# Patient Record
Sex: Male | Born: 1950 | Race: White | Hispanic: No | Marital: Married | Smoking: Never smoker
Health system: Southern US, Community
[De-identification: ages and names within clinical notes are randomized; demographics above are authoritative.]

## PROBLEM LIST (undated history)

## (undated) DIAGNOSIS — E785 Hyperlipidemia, unspecified: Secondary | ICD-10-CM

## (undated) DIAGNOSIS — E7800 Pure hypercholesterolemia, unspecified: Secondary | ICD-10-CM

## (undated) DIAGNOSIS — K219 Gastro-esophageal reflux disease without esophagitis: Secondary | ICD-10-CM

## (undated) DIAGNOSIS — M13171 Monoarthritis, not elsewhere classified, right ankle and foot: Secondary | ICD-10-CM

## (undated) DIAGNOSIS — I7300 Raynaud's syndrome without gangrene: Secondary | ICD-10-CM

## (undated) DIAGNOSIS — R001 Bradycardia, unspecified: Secondary | ICD-10-CM

## (undated) DIAGNOSIS — N529 Male erectile dysfunction, unspecified: Secondary | ICD-10-CM

## (undated) DIAGNOSIS — Z8489 Family history of other specified conditions: Secondary | ICD-10-CM

## (undated) DIAGNOSIS — M722 Plantar fascial fibromatosis: Secondary | ICD-10-CM

## (undated) DIAGNOSIS — C61 Malignant neoplasm of prostate: Secondary | ICD-10-CM

## (undated) DIAGNOSIS — E559 Vitamin D deficiency, unspecified: Secondary | ICD-10-CM

## (undated) HISTORY — DX: Male erectile dysfunction, unspecified: N52.9

## (undated) HISTORY — DX: Pure hypercholesterolemia, unspecified: E78.00

## (undated) HISTORY — DX: Plantar fascial fibromatosis: M72.2

## (undated) HISTORY — DX: Gastro-esophageal reflux disease without esophagitis: K21.9

## (undated) HISTORY — DX: Raynaud's syndrome without gangrene: I73.00

## (undated) HISTORY — PX: HERNIA REPAIR: SHX51

## (undated) HISTORY — DX: Vitamin D deficiency, unspecified: E55.9

## (undated) HISTORY — DX: Monoarthritis, not elsewhere classified, right ankle and foot: M13.171

## (undated) HISTORY — PX: TONSILLECTOMY: SUR1361

---

## 2003-10-14 HISTORY — PX: BUNIONECTOMY: SHX129

## 2005-06-05 ENCOUNTER — Ambulatory Visit (HOSPITAL_COMMUNITY): Admission: RE | Admit: 2005-06-05 | Discharge: 2005-06-05 | Payer: Self-pay | Admitting: Orthopedic Surgery

## 2005-06-05 ENCOUNTER — Ambulatory Visit (HOSPITAL_BASED_OUTPATIENT_CLINIC_OR_DEPARTMENT_OTHER): Admission: RE | Admit: 2005-06-05 | Discharge: 2005-06-05 | Payer: Self-pay | Admitting: Orthopedic Surgery

## 2011-01-03 ENCOUNTER — Encounter (INDEPENDENT_AMBULATORY_CARE_PROVIDER_SITE_OTHER): Payer: BC Managed Care – PPO

## 2011-01-03 DIAGNOSIS — M7989 Other specified soft tissue disorders: Secondary | ICD-10-CM
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
## 2011-06-11 ENCOUNTER — Encounter: Payer: Self-pay | Admitting: Gastroenterology

## 2014-03-22 ENCOUNTER — Encounter: Payer: Self-pay | Admitting: Gastroenterology

## 2016-04-29 ENCOUNTER — Ambulatory Visit (INDEPENDENT_AMBULATORY_CARE_PROVIDER_SITE_OTHER): Payer: PRIVATE HEALTH INSURANCE

## 2016-04-29 ENCOUNTER — Encounter: Payer: Self-pay | Admitting: Podiatry

## 2016-04-29 ENCOUNTER — Ambulatory Visit (INDEPENDENT_AMBULATORY_CARE_PROVIDER_SITE_OTHER): Payer: PRIVATE HEALTH INSURANCE | Admitting: Podiatry

## 2016-04-29 VITALS — BP 124/60 | HR 56 | Resp 12

## 2016-04-29 DIAGNOSIS — M722 Plantar fascial fibromatosis: Secondary | ICD-10-CM

## 2016-04-29 NOTE — Patient Instructions (Signed)
Plantar Fasciitis Plantar fasciitis is a painful foot condition that affects the heel. It occurs when the band of tissue that connects the toes to the heel bone (plantar fascia) becomes irritated. This can happen after exercising too much or doing other repetitive activities (overuse injury). The pain from plantar fasciitis can range from mild irritation to severe pain that makes it difficult for you to walk or move. The pain is usually worse in the morning or after you have been sitting or lying down for a while. CAUSES This condition may be caused by:  Standing for long periods of time.  Wearing shoes that do not fit.  Doing high-impact activities, including running, aerobics, and ballet.  Being overweight.  Having an abnormal way of walking (gait).  Having tight calf muscles.  Having high arches in your feet.  Starting a new athletic activity. SYMPTOMS The main symptom of this condition is heel pain. Other symptoms include:  Pain that gets worse after activity or exercise.  Pain that is worse in the morning or after resting.  Pain that goes away after you walk for a few minutes. DIAGNOSIS This condition may be diagnosed based on your signs and symptoms. Your health care provider will also do a physical exam to check for:  A tender area on the bottom of your foot.  A high arch in your foot.  Pain when you move your foot.  Difficulty moving your foot. You may also need to have imaging studies to confirm the diagnosis. These can include:  X-rays.  Ultrasound.  MRI. TREATMENT  Treatment for plantar fasciitis depends on the severity of the condition. Your treatment may include:  Rest, ice, and over-the-counter pain medicines to manage your pain.  Exercises to stretch your calves and your plantar fascia.  A splint that holds your foot in a stretched, upward position while you sleep (night splint).  Physical therapy to relieve symptoms and prevent problems in the  future.  Cortisone injections to relieve severe pain.  Extracorporeal shock wave therapy (ESWT) to stimulate damaged plantar fascia with electrical impulses. It is often used as a last resort before surgery.  Surgery, if other treatments have not worked after 12 months. HOME CARE INSTRUCTIONS  Take medicines only as directed by your health care provider.  Avoid activities that cause pain.  Roll the bottom of your foot over a bag of ice or a bottle of cold water. Do this for 20 minutes, 3-4 times a day.  Perform simple stretches as directed by your health care provider.  Try wearing athletic shoes with air-sole or gel-sole cushions or soft shoe inserts.  Wear a night splint while sleeping, if directed by your health care provider.  Keep all follow-up appointments with your health care provider. PREVENTION   Do not perform exercises or activities that cause heel pain.  Consider finding low-impact activities if you continue to have problems.  Lose weight if you need to. The best way to prevent plantar fasciitis is to avoid the activities that aggravate your plantar fascia. SEEK MEDICAL CARE IF:  Your symptoms do not go away after treatment with home care measures.  Your pain gets worse.  Your pain affects your ability to move or do your daily activities.   This information is not intended to replace advice given to you by your health care provider. Make sure you discuss any questions you have with your health care provider.   Document Released: 06/24/2001 Document Revised: 06/20/2015 Document Reviewed: 08/09/2014 Elsevier   Interactive Patient Education 2016 [employer] Inc.  

## 2016-04-29 NOTE — Progress Notes (Signed)
   Subjective:    Patient ID: Joshua MuskratDavid M Jarvis, male    DOB: 1951-01-14, 65 y.o.   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 He states that he is an avid runner and exercise enthusiast and has noticed with running he has more pain. He has had orthotics made in the past which seemed to help significantly. He is not interested in oral or injectable steroid anti-inflammatories nor is he interested in NSAIDs. He relates the ice and massage therapy have helped to some degree but have failed to alleviate his symptoms entirely. He states that he wears good tennis shoes during surgery and replaces them regularly. He presents today with his orthotics in his tennis shoes. He relates these orthotics are very old.    Review of Systems  Musculoskeletal: Positive for gait problem.       Objective:   Physical Exam: Vital signs are stable alert and oriented 3. Pulses are palpable. Neurologic sensorium is intact. Deep tendon reflexes are intact muscle strength is normal bilateral. Orthopedic evaluation demonstrates rectus foot type very narrow thin foot and prominent fifth metatarsals bilateral pain on palpation medial calcaneal tubercles bilateral. More significant on the left heel. Radiographic evaluation 3 views bilateral foot taken today demonstrates plantar distally oriented calcaneal heel spur left soft tissue increase in density of the plantar fascia calcaneal insertion site bilaterally. Cutaneous evaluation does not demonstrate any type of open wounds or lesions. No calluses.        Assessment & Plan:  Plantar fasciitis bilateral. This is long-standing and intractable.  Plan: He was scanned today for set of orthotics. At this point he will snow oral or injectable therapies. We started EPAT therapy today and he will follow-up next week with Joshua BumpsJessica for his second therapy.

## 2016-05-08 ENCOUNTER — Ambulatory Visit (INDEPENDENT_AMBULATORY_CARE_PROVIDER_SITE_OTHER): Payer: PRIVATE HEALTH INSURANCE

## 2016-05-08 DIAGNOSIS — M722 Plantar fascial fibromatosis: Secondary | ICD-10-CM

## 2016-05-09 NOTE — Progress Notes (Signed)
   Subjective:    Patient ID: Joshua Jarvis, male    DOB: 12-20-1950, 65 y.o.   MRN: [id number]  HPI Pt presents stating he has pain in the arches of both feet, states he has noticed improvement in pain since EPAT treatment last week   Review of Systems All other systems negative    Objective:   Physical Exam  Mild pain upon palpation of mid arch to plantar forefoot, just below metatarsal heads 4 of 10, Lt over Rt, but involvement of both feet      Assessment & Plan:  ESWT treatment administered today to bilateral arches of feet. Pt tolerated 5.5 joules of energy on each arch with intermittent pain 4-6 of 10 for 2000 pulses each. EPAT administered to surrounding connective tissues for 1500 pulses each. Re-appointed in 1 week for 3rd treatment. Advised against use of anti-inflammatories and ice.

## 2016-05-15 ENCOUNTER — Ambulatory Visit: Payer: PRIVATE HEALTH INSURANCE

## 2016-05-15 DIAGNOSIS — M722 Plantar fascial fibromatosis: Secondary | ICD-10-CM

## 2016-05-15 NOTE — Patient Instructions (Signed)

## 2016-05-16 NOTE — Progress Notes (Signed)
   Subjective:    Patient ID: Joshua Jarvis, male    DOB: Oct 04, 1951, 65 y.o.   MRN: [id number]  HPI Pt presents stating he has pain in the arches of both feet, states he has noticed improvement in pain since EPAT treatment last week   Review of Systems All other systems negative    Objective:   Physical Exam  Mild pain upon palpation of mid arch to plantar forefoot, just below metatarsal heads 4 of 10, Lt over Rt, but involvement of both feet      Assessment & Plan:  ESWT treatment administered today to bilateral arches of feet. Pt tolerated 6.5 joules of energy on each arch with intermittent pain 4-6 of 10 for 2000 pulses each. EPAT administered to surrounding connective tissues for 1500 pulses each. Re-appointed in 4 week for 4th  Treatment and re-evaluation with Dr Al Corpus. Advised against use of anti-inflammatories and ice.

## 2016-06-12 ENCOUNTER — Ambulatory Visit (INDEPENDENT_AMBULATORY_CARE_PROVIDER_SITE_OTHER): Payer: PRIVATE HEALTH INSURANCE

## 2016-06-12 DIAGNOSIS — M722 Plantar fascial fibromatosis: Secondary | ICD-10-CM

## 2016-06-12 NOTE — Progress Notes (Signed)
   Subjective:    Patient ID: Joshua MuskratDavid M Dario, male    DOB: [birth year], 65 y.o.   MRN: [id number]  HPI Pt presents stating he has pain in the arches of both feet, states he has noticed improvement in pain since last treatment. He is able to stand for longer periods of time.    Review of Systems All other systems negative    Objective:   Physical Exam  Mild pain upon palpation of mid arch to plantar forefoot, just below metatarsal heads 4 of 10, Lt over Rt, but involvement of both feet      Assessment & Plan:  ESWT treatment administered today to bilateral arches of feet. Pt tolerated 7 joules of energy on each arch with intermittent pain 4-6 of 10 for 2000 pulses each. EPAT administered to surrounding connective tissues for 1500 pulses each. Re-appointed in 6 weeks for re-evaluation. Orthotics were sent back for adjustments today as well

## 2016-06-25 ENCOUNTER — Telehealth: Payer: Self-pay | Admitting: Podiatry

## 2016-06-25 NOTE — Telephone Encounter (Signed)
Left message for patient that adjusted Orthotics are ready to be picked up 

## 2016-07-24 ENCOUNTER — Ambulatory Visit: Payer: PRIVATE HEALTH INSURANCE | Admitting: Podiatry

## 2016-07-31 ENCOUNTER — Ambulatory Visit: Payer: PRIVATE HEALTH INSURANCE | Admitting: Podiatry

## 2016-08-21 ENCOUNTER — Ambulatory Visit: Payer: PRIVATE HEALTH INSURANCE | Admitting: Podiatry

## 2016-09-23 ENCOUNTER — Ambulatory Visit: Payer: PRIVATE HEALTH INSURANCE | Admitting: Podiatry

## 2016-10-16 ENCOUNTER — Encounter: Payer: Self-pay | Admitting: Podiatry

## 2016-10-16 ENCOUNTER — Ambulatory Visit (INDEPENDENT_AMBULATORY_CARE_PROVIDER_SITE_OTHER): Payer: Self-pay | Admitting: Podiatry

## 2016-10-16 DIAGNOSIS — M722 Plantar fascial fibromatosis: Secondary | ICD-10-CM

## 2016-10-19 NOTE — Progress Notes (Signed)
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 He states that he is at least 80-85% improved.  Objective: Pulses are strongly palpable no reproducible pain on palpation of bilateral heels. There is no erythema cellulitis drainage or odor warmth on palpation. No ecchymosis noted heel pain on medial and lateral compression of the calcaneus.  Assessment: Well-healing plantar fasciitis secondary therapy.  Plan: He will follow up with us on an as-needed basis.  Dr. [name] calls and requests EPAT therapy with Joshua Jarvis he is to be scheduled at his convenience without having to see me.

## 2018-10-08 ENCOUNTER — Other Ambulatory Visit: Payer: Self-pay | Admitting: Internal Medicine

## 2018-10-08 DIAGNOSIS — E7800 Pure hypercholesterolemia, unspecified: Secondary | ICD-10-CM

## 2018-11-16 ENCOUNTER — Ambulatory Visit
Admission: RE | Admit: 2018-11-16 | Discharge: 2018-11-16 | Disposition: A | Discharge status: Home / Self Care | Payer: Self-pay | Source: Ambulatory Visit | Attending: Internal Medicine | Admitting: Internal Medicine

## 2018-11-16 DIAGNOSIS — E7800 Pure hypercholesterolemia, unspecified: Secondary | ICD-10-CM

## 2018-11-16 DIAGNOSIS — R931 Abnormal findings on diagnostic imaging of heart and coronary circulation: Secondary | ICD-10-CM

## 2018-11-16 HISTORY — DX: Abnormal findings on diagnostic imaging of heart and coronary circulation: R93.1

## 2018-12-03 ENCOUNTER — Other Ambulatory Visit: Payer: Self-pay

## 2018-12-14 ENCOUNTER — Encounter: Payer: Self-pay | Admitting: Interventional Cardiology

## 2018-12-14 NOTE — Progress Notes (Signed)
Cardiology Office Note   Date:  12/15/2018   ID:  Joshua Jarvis, DOB 07-24-1951, MRN [id number]  PCP:  Kirby Funk, MD    No chief complaint on file.  CAD  Wt Readings from Last 3 Encounters:  12/15/18 184 lb 6.4 oz (83.6 kg)       History of Present Illness: Joshua Jarvis is a 68 y.o. male who is being seen today for the evaluation of elevated coronary calcium at the request of Kirby Funk, MD.  Coronary calcium score CT done in 2/20 showed: The observed calcium score of 1149 is at the percentile 92 for subjects of the same age, gender and race/ethnicity who are free of clinical cardiovascular disease and treated diabetes.  Since the CT, he has been dialing back on his exercise.  He typically uses the treadmill and goes to a HR of 150.    Denies : Chest pain. Dizziness. Leg edema. Nitroglycerin use. Orthopnea. Palpitations. Paroxysmal nocturnal dyspnea. Shortness of breath. Syncope.   He mountain bikes as well.  He swims and uses the elliptical.    Past Medical History:  Diagnosis Date  . Erectile dysfunction   . GERD (gastroesophageal reflux disease)   . Hypercholesterolemia   . Monoarthritis of foot, right   . Plantar fasciitis   . 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 Vitamin D deficiency     No past surgical history on file.   Current Outpatient Medications  Medication Sig Dispense Refill  . aspirin EC 81 MG tablet Take 81 mg by mouth daily.    Marland Kitchen atorvastatin (LIPITOR) 40 MG tablet Take 40 mg by mouth daily.    . cholecalciferol (VITAMIN D3) 25 MCG (1000 UT) tablet Take 1,000 Units by mouth daily.    . famotidine (PEPCID AC) 10 MG chewable tablet Chew 10 mg by mouth 2 (two) times daily.    . Multiple Vitamin (MULTIVITAMIN) capsule Take 1 capsule by mouth daily.    . sildenafil (REVATIO) 20 MG tablet Take 20 mg by mouth as needed (take 2-5 tabs daily as needed).     No current facility-administered medications for  this visit.     Allergies:   Patient has no known allergies.    Social History:  The patient  reports that he has never smoked. He has never used smokeless tobacco. He reports current alcohol use. He reports that he does not use drugs.   Family History:  The patient's family history includes Atrial fibrillation in his father; Heart failure in his mother.    ROS:  Please see the history of present illness.   Otherwise, review of systems are positive for decreasing exercise since getting his CT result.   All other systems are reviewed and negative.    PHYSICAL EXAM: VS:  BP 136/80   Pulse (!) 49   Ht 6\' 2"  (1.88 m)   Wt 184 lb 6.4 oz (83.6 kg)   SpO2 98%   BMI 23.68 kg/m  , BMI Body mass index is 23.68 kg/m. GEN: Well nourished, well developed, in no acute distress  HEENT: normal  Neck: no JVD, carotid bruits, or masses Cardiac: RRR; no murmurs, rubs, or gallops,no edema  Respiratory:  clear to auscultation bilaterally, normal work of breathing GI: soft, nontender, nondistended, + BS MS: no deformity or atrophy  Skin: warm and dry, no rash Neuro:  Strength and sensation are intact Psych: euthymic mood, full affect  EKG:   The ekg ordered today demonstrates sinus bradycardia, no ST changes   Recent Labs: No results found for requested labs within last 8760 hours.   Lipid Panel No results found for: CHOL, TRIG, HDL, CHOLHDL, VLDL, LDLCALC, LDLDIRECT   Other studies Reviewed: Additional studies/ records that were reviewed today with results demonstrating: Labs reviewed.  HDL was 67 but he says this is a decrease from prior.   ASSESSMENT AND PLAN:  1. CAD: Elevated calcium score.  He achieves a high level of exercise at home.  No angina.  Will check exercise treadmill test to ensure no objective signs of ischemia with high-level exercise.  I would like him to be pushed to his maximum calculated heart rate of 154 bpm.  He typically pushes himself to approximately this  level when exercising at home. 2. Hyperlipidemia: Atorvastatin 40 mg daily was started.  LDL target would be less than 100. 3. Vegetarian diet: We spoke at length about theplant based diet.  He already eats a very healthy, plant-based diet.   Current medicines are reviewed at length with the patient today.  The patient concerns regarding his medicines were addressed.  The following changes have been made:  No change  Labs/ tests ordered today include:  No orders of the defined types were placed in this encounter.   Recommend 150 minutes/week of aerobic exercise Low fat, low carb, high fiber diet recommended  Disposition:   FU in 1 year; if he has any significant abnormality on the treadmill test, would consider CT angiogram.   Signed, Lance Muss, MD  12/15/2018 3:46 PM    [facility] [address] [place], [place], [place]  [address] Phone: [phone]; Fax: [phone]

## 2018-12-15 ENCOUNTER — Encounter: Payer: Self-pay | Admitting: Interventional Cardiology

## 2018-12-15 ENCOUNTER — Encounter

## 2018-12-15 ENCOUNTER — Ambulatory Visit: Payer: PRIVATE HEALTH INSURANCE | Admitting: Interventional Cardiology

## 2018-12-15 VITALS — BP 136/80 | HR 49 | Ht 74.0 in | Wt 184.4 lb

## 2018-12-15 DIAGNOSIS — E782 Mixed hyperlipidemia: Secondary | ICD-10-CM | POA: Diagnosis not present

## 2018-12-15 DIAGNOSIS — R931 Abnormal findings on diagnostic imaging of heart and coronary circulation: Secondary | ICD-10-CM

## 2018-12-15 DIAGNOSIS — I2510 Atherosclerotic heart disease of native coronary artery without angina pectoris: Secondary | ICD-10-CM | POA: Diagnosis not present

## 2018-12-15 NOTE — Patient Instructions (Signed)
Medication Instructions:  Your physician recommends that you continue on your current medications as directed. Please refer to the Current Medication list given to you today.  If you need a refill on your cardiac medications before your next appointment, please call your pharmacy.   Lab work: None Ordered  If you have labs (blood work) drawn today and your tests are completely normal, you will receive your results only by: Marland Kitchen MyChart Message (if you have MyChart) OR . A paper copy in the mail If you have any lab test that is abnormal or we need to change your treatment, we will call you to review the results.  Testing/Procedures: Your physician has requested that you have an exercise tolerance test. For further information please visit [email or web address] Please also follow instruction sheet, as given.  Follow-Up: At [facility], you and your health needs are our priority.  As part of our continuing mission to provide you with exceptional heart care, we have created designated Provider Care Teams.  These Care Teams include your primary Cardiologist (physician) and Advanced Practice Providers (APPs -  Physician Assistants and Nurse Practitioners) who all work together to provide you with the care you need, when you need it. . You will need a follow up appointment in 1 year.  Please call our office 2 months in advance to schedule this appointment.  You may see Everette Rank, MD or one of the following Advanced Practice Providers on your designated Care Team:   . Robbie Lis, PA-C . Dayna Dunn, PA-C . Jacolyn Reedy, PA-C  Any Other Special Instructions Will Be Listed Below (If Applicable).

## 2018-12-24 ENCOUNTER — Other Ambulatory Visit: Payer: Self-pay

## 2018-12-24 ENCOUNTER — Ambulatory Visit (INDEPENDENT_AMBULATORY_CARE_PROVIDER_SITE_OTHER): Payer: PRIVATE HEALTH INSURANCE

## 2018-12-24 DIAGNOSIS — I2510 Atherosclerotic heart disease of native coronary artery without angina pectoris: Secondary | ICD-10-CM

## 2018-12-24 DIAGNOSIS — R931 Abnormal findings on diagnostic imaging of heart and coronary circulation: Secondary | ICD-10-CM | POA: Diagnosis not present

## 2018-12-24 LAB — EXERCISE TOLERANCE TEST
Estimated workload: 16.1 METS
Exercise duration (min): 12 min
Exercise duration (sec): 45 s
MPHR: 154 {beats}/min
Peak HR: 153 {beats}/min
Percent HR: 153 %
RPE: 18
Rest HR: 50 {beats}/min

## 2019-01-13 ENCOUNTER — Ambulatory Visit: Payer: No Typology Code available for payment source | Admitting: Interventional Cardiology

## 2019-02-03 DIAGNOSIS — K2270 Barrett's esophagus without dysplasia: Secondary | ICD-10-CM | POA: Insufficient documentation

## 2019-02-03 DIAGNOSIS — Z86010 Personal history of colonic polyps: Secondary | ICD-10-CM | POA: Insufficient documentation

## 2019-12-18 NOTE — Progress Notes (Signed)
Cardiology Office Note   Date:  12/19/2019   ID:  CALIB WADHWA, DOB 1950-12-12, MRN [id number]  PCP:  Kirby Funk, MD    No chief complaint on file.  Coronary calcification  Wt Readings from Last 3 Encounters:  12/19/19 183 lb 12.8 oz (83.4 kg)  12/15/18 184 lb 6.4 oz (83.6 kg)       History of Present Illness: Joshua Jarvis is a 69 y.o. male   who is being seen today for the evaluation of elevated coronary calcium at the request of Kirby Funk, MD.  He is a Engineer, petroleum.  Coronary calcium score CT done in 2/20 showed: The observed calcium score of 1149 is at the percentile 92 for subjects of the same age, gender and race/ethnicity who are free of clinical cardiovascular disease and treated diabetes.  Since the CT, he has been dialing back on his exercise.  He typically uses the treadmill and goes to a HR of 150.    Exercise in the past includes mountain bike, swimming and elliptical.   ETT was done in March 2020 showing:  "Blood pressure demonstrated a normal response to exercise.  There was no ST segment deviation noted during stress.   Excellent exercise capacity, the patient exercised for 12 minutes 45 seconds and achieved 16 METS.  Normal blood pressure response to exertion.  No EKG evidence of ischemia, normal exercise treadmill stress test."  Since the last visit, he has had some twinges at random in his chest. He cancelled his [place] trip. He thinks it is reflux.  No problems with exercise.  He is still running 3x/week.  Mountain biking has decreased with rainy weather.    Denies : Exertional Chest pain. Dizziness. Leg edema. Nitroglycerin use. Orthopnea. Palpitations. Paroxysmal nocturnal dyspnea. Shortness of breath. Syncope.   He is vaccinated.     Past Medical History:  Diagnosis Date  . Erectile dysfunction   . GERD (gastroesophageal reflux disease)   . Hypercholesterolemia   . Monoarthritis of foot, right   . Plantar fasciitis     . 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 Vitamin D deficiency     History reviewed. No pertinent surgical history.   Current Outpatient Medications  Medication Sig Dispense Refill  . aspirin EC 81 MG tablet Take 81 mg by mouth daily.    Marland Kitchen atorvastatin (LIPITOR) 40 MG tablet Take 40 mg by mouth daily.    . cholecalciferol (VITAMIN D3) 25 MCG (1000 UT) tablet Take 1,000 Units by mouth daily.    . Multiple Vitamin (MULTIVITAMIN) capsule Take 1 capsule by mouth daily.    Marland Kitchen omeprazole (PRILOSEC) 20 MG capsule Take 20 mg by mouth 2 (two) times daily.    . sildenafil (REVATIO) 20 MG tablet Take 20 mg by mouth as needed (take 2-5 tabs daily as needed).     No current facility-administered medications for this visit.    Allergies:   Patient has no known allergies.    Social History:  The patient  reports that he has never smoked. He has never used smokeless tobacco. He reports current alcohol use. He reports that he does not use drugs.   Family History:  The patient's family history includes Atrial fibrillation in his father; Heart failure in his mother.    ROS:  Please see the history of present illness.   Otherwise, review of systems are positive for leg issues that limit his running  as he gets older.   All other systems are reviewed and negative.    PHYSICAL EXAM: VS:  BP 130/72   Pulse (!) 41   Ht 6\' 3"  (1.905 m)   Wt 183 lb 12.8 oz (83.4 kg)   SpO2 98%   BMI 22.97 kg/m  , BMI Body mass index is 22.97 kg/m. GEN: Well nourished, well developed, in no acute distress  HEENT: normal  Neck: no JVD, carotid bruits, or masses Cardiac: bradycardic; no murmurs, rubs, or gallops,no edema  Respiratory:  clear to auscultation bilaterally, normal work of breathing GI: soft, nontender, nondistended, + BS MS: no deformity or atrophy  Skin: warm and dry, no rash Neuro:  Strength and sensation are intact Psych: euthymic mood, full affect   EKG:   The ekg  ordered today demonstrates sinus bradycardia, no ST changes   Recent Labs: No results found for requested labs within last 8760 hours.   Lipid Panel No results found for: CHOL, TRIG, HDL, CHOLHDL, VLDL, LDLCALC, LDLDIRECT   Other studies Reviewed: Additional studies/ records that were reviewed today with results demonstrating: labs reviewed; calcium score CT reviewed.   ASSESSMENT AND PLAN:  1.   Coronary calcification/CAD:  No angina. Continue aggressive secondary prevention.  HR already quite slow.  Would not be a candidate for rate slowing drugs.  2.   Hyperlipidemia: Eating vegetarian diet.  Lipids well controlled.  3. Continue regular exercise.  4. He has received his COVID vaccines.    Current medicines are reviewed at length with the patient today.  The patient concerns regarding his medicines were addressed.  The following changes have been made:  No change  Labs/ tests ordered today include:   Orders Placed This Encounter  Procedures  . EKG 12-Lead    Recommend 150 minutes/week of aerobic exercise Low fat, low carb, high fiber diet recommended  Disposition:   FU in 1 year   Signed, Larae Grooms, MD  12/19/2019 5:20 PM    [facility] [place], [place], [place]  [address] Phone: [phone]; Fax: [phone]

## 2019-12-19 ENCOUNTER — Encounter: Payer: Self-pay | Admitting: Interventional Cardiology

## 2019-12-19 ENCOUNTER — Other Ambulatory Visit: Payer: Self-pay

## 2019-12-19 ENCOUNTER — Ambulatory Visit: Payer: PRIVATE HEALTH INSURANCE | Admitting: Interventional Cardiology

## 2019-12-19 VITALS — BP 130/72 | HR 41 | Ht 75.0 in | Wt 183.8 lb

## 2019-12-19 DIAGNOSIS — E782 Mixed hyperlipidemia: Secondary | ICD-10-CM

## 2019-12-19 DIAGNOSIS — R931 Abnormal findings on diagnostic imaging of heart and coronary circulation: Secondary | ICD-10-CM

## 2019-12-19 DIAGNOSIS — I2510 Atherosclerotic heart disease of native coronary artery without angina pectoris: Secondary | ICD-10-CM

## 2019-12-19 NOTE — Patient Instructions (Signed)

## 2020-02-10 IMAGING — CT CT HEART SCORING
3 series · 14 of 20 positions shown, 16 images · non-contrast
Comparison: None.

CLINICAL DATA: Hypercholesterolemia

EXAM:
CT HEART FOR CALCIUM SCORING
TECHNIQUE: CT heart was performed using prospective ECG gating.
A non-contrast exam for calcium scoring was performed.
Note that this exam targets the heart and the chest was not imaged
in its entirety.

[Series 2: calcium scoring 2.00 qr36 bestdiast 69% · axial · 0.42mm/px · z∈[+1608,+1704]mm · 4 of 80 slices shown]
[im 16/80  vessel]
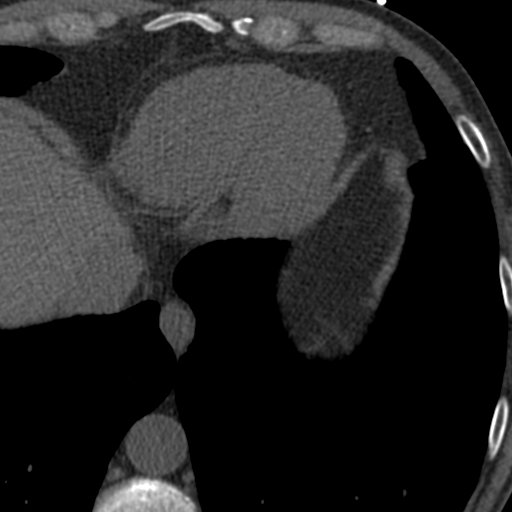
[im 32/80  vessel]
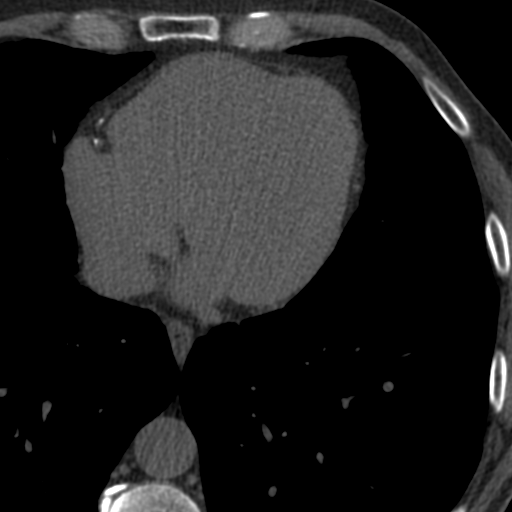
[im 48/80  vessel]
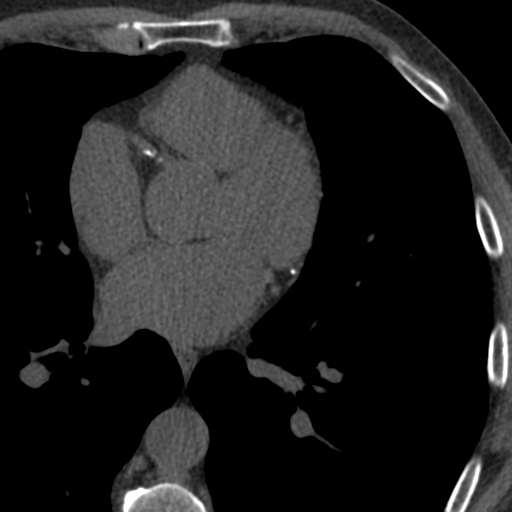
[im 64/80  vessel]
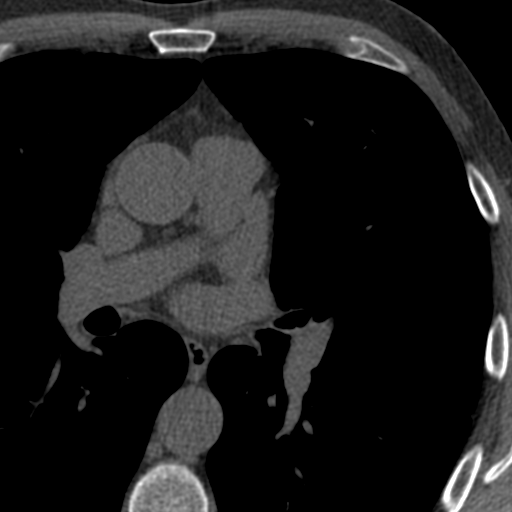

[Series 3: calcium scoring 2.00 br40 bestdiast 69% ax fov · axial · 0.37mm/px · z∈[+1604,+1708]mm · 5 of 80 slices shown, 7 images]
[im 14/80  vessel]
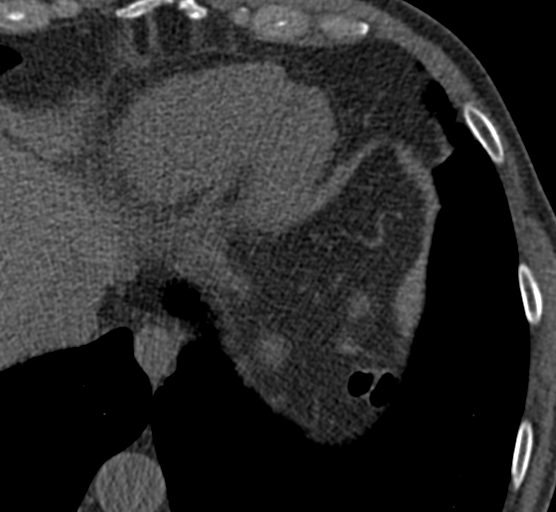
[im 14/80  lung]
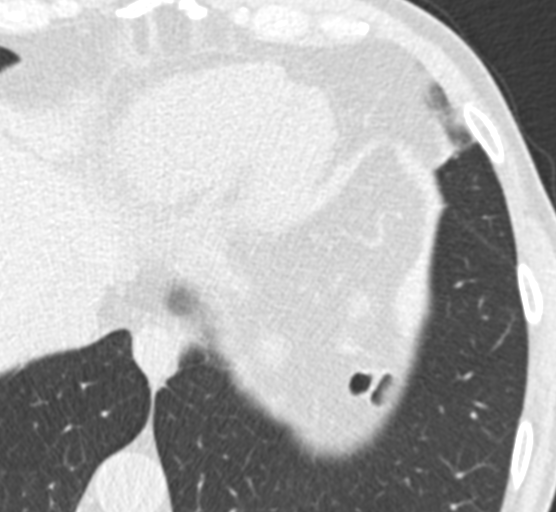
[im 27/80  vessel]
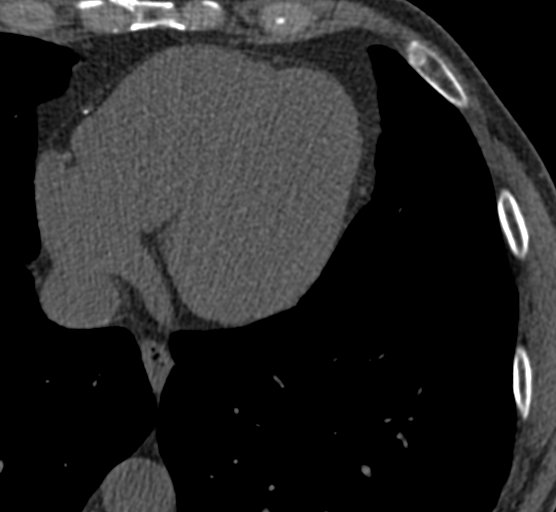
[im 40/80  vessel]
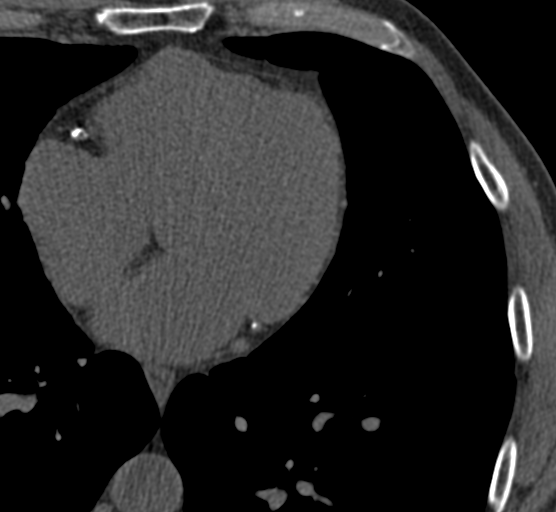
[im 53/80  vessel]
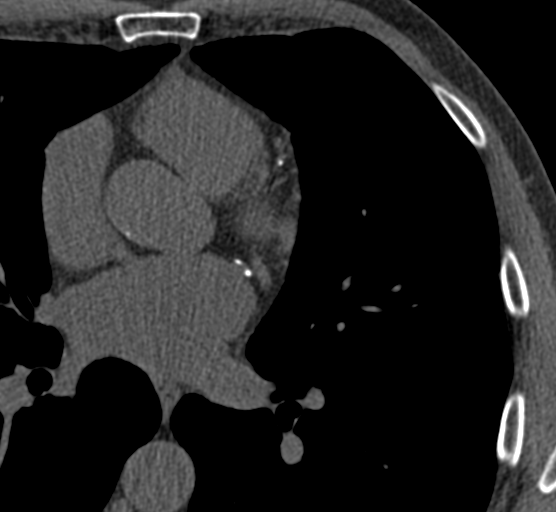
[im 66/80  vessel]
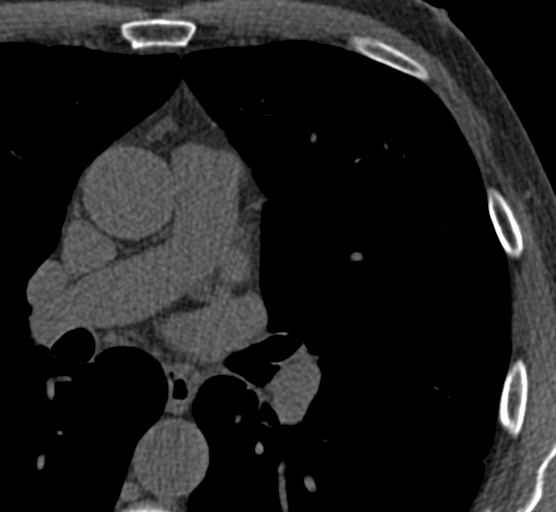
[im 66/80  lung]
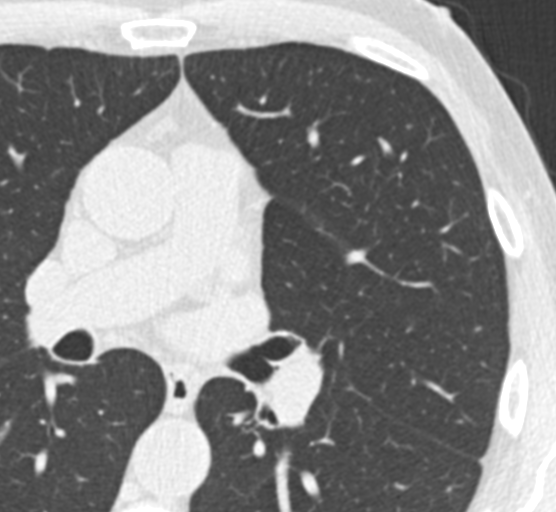

[Series 9: calcium scoring 2.00 br60 bestdiast 69% ax fov · axial · 0.37mm/px · z∈[+1604,+1708]mm · 5 of 80 slices shown]
[im 14/80  vessel]
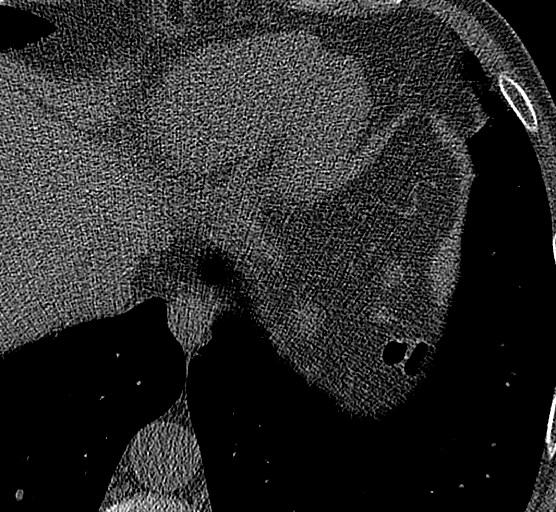
[im 27/80  vessel]
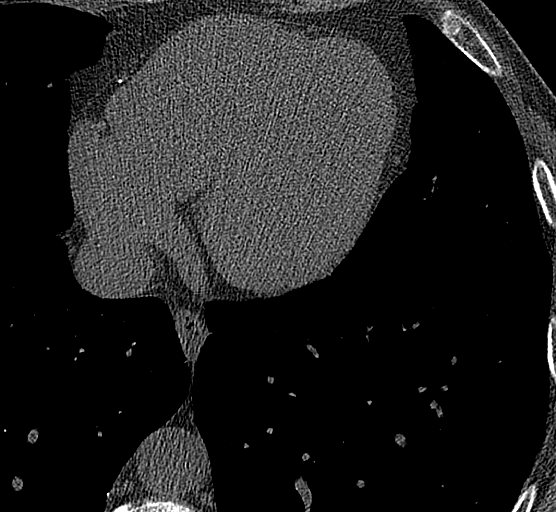
[im 40/80  vessel]
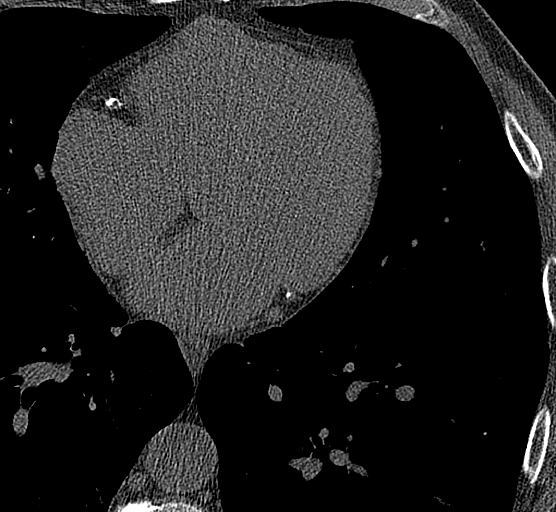
[im 53/80  vessel]
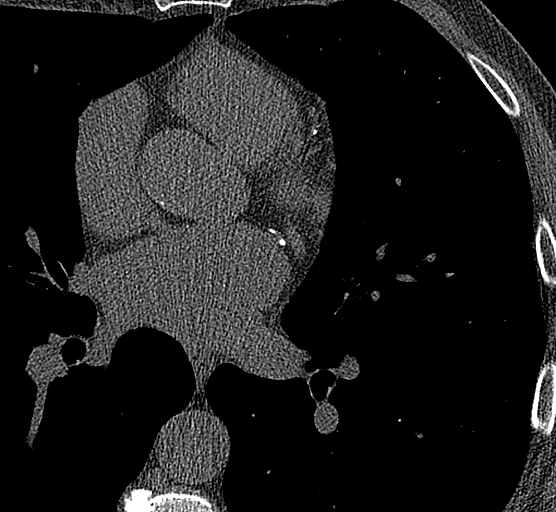
[im 66/80  vessel]
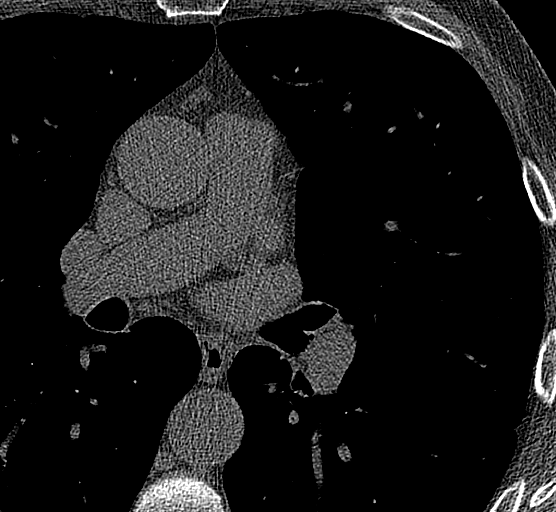

[14 of 20 positions shown; findings below may reference images not displayed]

FINDINGS: Technical quality: Good.

CORONARY CALCIUM

Total Agatston Score: 9980 with extensive calcifications in the left
anterior descending, left circumflex and right coronary arteries.

[HOSPITAL] percentile:  92

OTHER FINDINGS:

Cardiovascular: Heart is normal size. Visualized aorta is normal
caliber.

Mediastinum/Nodes: No adenopathy in the lower mediastinum or hila.

Lungs/Pleura: Visualized lungs clear.  No visible effusions.

Upper Abdomen: Imaging into the upper abdomen shows no acute
findings.

Musculoskeletal: Chest wall soft tissues are unremarkable. No acute
bony abnormality.
IMPRESSION: The observed calcium score of 9980 is at the percentile 92 for
subjects of the same age, gender and race/ethnicity who are free of
clinical cardiovascular disease and treated diabetes.

No acute or significant extracardiac abnormality

## 2020-05-15 ENCOUNTER — Telehealth: Payer: Self-pay | Admitting: Interventional Cardiology

## 2020-05-15 NOTE — Telephone Encounter (Signed)
Patient called and is requesting a copy of his medical records. 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  He would like any visit notes and the results from his stress test.  Please call the patient when the records are ready for him to pick up.

## 2020-07-17 ENCOUNTER — Ambulatory Visit: Payer: PRIVATE HEALTH INSURANCE

## 2020-12-13 ENCOUNTER — Other Ambulatory Visit: Payer: Self-pay

## 2020-12-13 ENCOUNTER — Ambulatory Visit (INDEPENDENT_AMBULATORY_CARE_PROVIDER_SITE_OTHER): Payer: PRIVATE HEALTH INSURANCE | Admitting: Podiatry

## 2020-12-13 DIAGNOSIS — L600 Ingrowing nail: Secondary | ICD-10-CM | POA: Diagnosis not present

## 2020-12-13 DIAGNOSIS — M13171 Monoarthritis, not elsewhere classified, right ankle and foot: Secondary | ICD-10-CM | POA: Insufficient documentation

## 2020-12-13 DIAGNOSIS — N529 Male erectile dysfunction, unspecified: Secondary | ICD-10-CM | POA: Insufficient documentation

## 2020-12-13 DIAGNOSIS — E7800 Pure hypercholesterolemia, unspecified: Secondary | ICD-10-CM | POA: Insufficient documentation

## 2020-12-13 DIAGNOSIS — E559 Vitamin D deficiency, unspecified: Secondary | ICD-10-CM | POA: Insufficient documentation

## 2020-12-13 MED ORDER — NEOMYCIN-POLYMYXIN-HC 1 % OT SOLN: OTIC | 1 refills | Status: DC

## 2020-12-13 NOTE — Patient Instructions (Signed)

## 2020-12-13 NOTE — Progress Notes (Signed)
Subjective:  Patient ID: Joshua Jarvis, male    DOB: 01-28-1951,  MRN: [id number] HPI Chief Complaint  Patient presents with  . Nail Problem    Pt has a brown spot on his toe nail- pt says that it is not painful- pt states that he just noticed it 1-2 weeks ago    70 y.o. male presents with the above complaint.   ROS: He denies fever chills nausea vomiting muscle aches pains calf pain back pain chest pain shortness of breath.  Past Medical History:  Diagnosis Date  . Erectile dysfunction   . GERD (gastroesophageal reflux disease)   . Hypercholesterolemia   . Monoarthritis of foot, right   . Plantar fasciitis   . 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 Vitamin D deficiency    No past surgical history on file.  Current Outpatient Medications:  .  NEOMYCIN-POLYMYXIN-HYDROCORTISONE (CORTISPORIN) 1 % SOLN OTIC solution, Apply 1-2 drops to toe BID after soaking, Disp: 10 mL, Rfl: 1 .  aspirin EC 81 MG tablet, Take 81 mg by mouth daily., Disp: , Rfl:  .  atorvastatin (LIPITOR) 40 MG tablet, Take 40 mg by mouth daily., Disp: , Rfl:  .  cholecalciferol (VITAMIN D3) 25 MCG (1000 UT) tablet, Take 1,000 Units by mouth daily., Disp: , Rfl:  .  Multiple Vitamin (MULTIVITAMIN) capsule, Take 1 capsule by mouth daily., Disp: , Rfl:  .  omeprazole (PRILOSEC) 20 MG capsule, Take 20 mg by mouth 2 (two) times daily., Disp: , Rfl:  .  sildenafil (REVATIO) 20 MG tablet, Take 20 mg by mouth as needed (take 2-5 tabs daily as needed)., Disp: , Rfl:   No Known Allergies Review of Systems Objective:  There were no vitals filed for this visit.  General: Well developed, nourished, in no acute distress, alert and oriented x3   Dermatological: Skin is warm, dry and supple bilateral. Nails x 10 are well maintained; remaining integument appears unremarkable at this time. There are no open sores, no preulcerative lesions, no rash or signs of infection present.  Nail plate second  digit right foot does demonstrate a discoloration which is easily removed with a bur today obviously with superficial not deep or within the bed itself.  Contralateral second toe demonstrates a thick yellow painful nail plate.  He wishes to have this removed there is mild erythema and some proximal onycholysis.  Vascular: Dorsalis Pedis artery and Posterior Tibial artery pedal pulses are 2/4 bilateral with immedate capillary fill time. Pedal hair growth present. No varicosities and no lower extremity edema present bilateral.   Neruologic: Grossly intact via light touch bilateral. Vibratory intact via tuning fork bilateral. Protective threshold with Semmes Wienstein monofilament intact to all pedal sites bilateral. Patellar and Achilles deep tendon reflexes 2+ bilateral. No Babinski or clonus noted bilateral.   Musculoskeletal: No gross boney pedal deformities bilateral. No pain, crepitus, or limitation noted with foot and ankle range of motion bilateral. Muscular strength 5/5 in all groups tested bilateral.  Gait: Unassisted, Nonantalgic.    Radiographs:  None taken  Assessment & Plan:   Assessment: Discoloration on the nail with some superficial was not deep in the nail and was not subungual.  This was the second nail of the right foot.  Contralateral foot demonstrates a thick yellow painful ingrown toenail second digit left.   Plan: Discussed etiology pathology conservative surgical therapies was able to remove the discoloration of the nail second right with a  bur.  At this point he requested a total matrixectomy of the second digit of the left foot.  This was performed today after local anesthetic was administered tolerated procedure well without complications.  He was provided both oral and written home-going instructions for the care and soaking of his toe I will follow-up with him in a couple weeks if necessary.     Noelly Lasseigne T. [place], [place]

## 2020-12-27 ENCOUNTER — Ambulatory Visit: Payer: PRIVATE HEALTH INSURANCE | Admitting: Podiatry

## 2021-03-06 ENCOUNTER — Ambulatory Visit: Payer: PRIVATE HEALTH INSURANCE | Admitting: Orthopaedic Surgery

## 2023-01-19 NOTE — Progress Notes (Unsigned)
Cardiology Office Note   Date:  01/20/2023   ID:  Joshua Jarvis, DOB Jun 11, 1951, MRN [id number]  PCP:  Kirby Funk, MD    No chief complaint on file.  Coronary artery calcification  Wt Readings from Last 3 Encounters:  01/20/23 181 lb 3.2 oz (82.2 kg)  12/19/19 183 lb 12.8 oz (83.4 kg)  12/15/18 184 lb 6.4 oz (83.6 kg)       History of Present Illness: Joshua Jarvis is a 72 y.o. male   is a Engineer, petroleum.   Coronary calcium score CT done in 2/20 showed: The observed calcium score of 1149 is at the percentile 92 for subjects of the same age, gender and race/ethnicity who are free of clinical cardiovascular disease and treated diabetes.   Immediately after the CT, he dialed back on his exercise.  He typically uses the treadmill and goes to a HR of 150.     Exercise in the past includes mountain bike, swimming and elliptical.    ETT was done in March 2020 showing: "Blood pressure demonstrated a normal response to exercise. There was no ST segment deviation noted during stress.   Excellent exercise capacity, the patient exercised for 12 minutes 45 seconds and achieved 16 METS.  Normal blood pressure response to exertion.  No EKG evidence of ischemia, normal exercise treadmill stress test."   In 2021, he has had some twinges at random in his chest. He cancelled his [place] trip. He thinks it is reflux.  No problems with exercise.  He is still running 3x/week.  Mountain biking has decreased with rainy weather.    I last saw him in 2021.  As of 2024, Still working.  He has been trying to exercise.  Rides a mountain bike.  Feels that his stamina has decreased.  Decreased exercise tolerance.  Denies : Chest pain. Dizziness. Leg edema. Nitroglycerin use. Orthopnea. Palpitations. Paroxysmal nocturnal dyspnea. Shortness of breath. Syncope.    Chronic right leg edema.  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 No pertinent surgical history.   Current Outpatient Medications  Medication Sig Dispense Refill   atorvastatin (LIPITOR) 40 MG tablet Take 40 mg by mouth daily.     cholecalciferol (VITAMIN D3) 25 MCG (1000 UT) tablet Take 1,000 Units by mouth daily.     Multiple Vitamin (MULTIVITAMIN) capsule Take 1 capsule by mouth daily.     omeprazole (PRILOSEC) 20 MG capsule Take 20 mg by mouth 2 (two) times daily.     sildenafil (REVATIO) 20 MG tablet Take 20 mg by mouth as needed (take 2-5 tabs daily as needed).     aspirin EC 81 MG tablet Take 81 mg by mouth daily. (Patient not taking: Reported on 01/20/2023)     NEOMYCIN-POLYMYXIN-HYDROCORTISONE (CORTISPORIN) 1 % SOLN OTIC solution Apply 1-2 drops to toe BID after soaking (Patient not taking: Reported on 01/20/2023) 10 mL 1   No current facility-administered medications for this visit.    Allergies:   Patient has no known allergies.    Social History:  The patient  reports that he has never smoked. He has never used smokeless tobacco. He reports current alcohol use. He reports  that he does not use drugs.   Family History:  The patient's family history includes Atrial fibrillation in his father; Heart failure in his mother.    ROS:  Please see the history of present illness.   Otherwise, review of systems are positive for feels that his stamina has dropped.   All other systems are reviewed and negative.    PHYSICAL EXAM: VS:  BP 110/60   Pulse (!) 55   Ht 6\' 2"  (1.88 m)   Wt 181 lb 3.2 oz (82.2 kg)   SpO2 98%   BMI 23.26 kg/m  , BMI Body mass index is 23.26 kg/m. GEN: Well nourished, well developed, in no acute distress HEENT: normal Neck: no JVD, carotid bruits, or masses Cardiac: RRR; no murmurs, rubs, or gallops,no edema   Respiratory:  clear to auscultation bilaterally, normal work of breathing GI: soft, nontender, nondistended, + BS MS: no deformity or atrophy Skin: warm and dry, no rash Neuro:  Strength and sensation are intact Psych: euthymic mood, full affect   EKG:   The ekg ordered today demonstrates NSR, nonspecific ST changes   Recent Labs: No results found for requested labs within last 365 days.   Lipid Panel No results found for: "CHOL", "TRIG", "HDL", "CHOLHDL", "VLDL", "LDLCALC", "LDLDIRECT"   Other studies Reviewed: Additional studies/ records that were reviewed today with results demonstrating: .   ASSESSMENT AND PLAN:  Coronary artery calcification: Continue aggressive secondary prevention.  Whole food, plant-based diet.  Not a candidate for rate slowing drugs due to slow resting heart rate.  Given the decreased exercise tolerance, will plan for exercise Myoview.  Relatively high calcium score given 92nd percentile back in 2020.  Try to maintain active lifestyle.  There will be some natural slowing and decreased stamina but try to slow the rate of decrease. Hyperlipidemia:  Awaiting recent labs results.  2023 lipids were well-controlled. Bradycardia: No symptoms of lightheadedness, dizziness,  passing out. Family h/o AFib.  Younger sister has AFib.  Both parents had AFib.    Current medicines are reviewed at length with the patient today.  The patient concerns regarding his medicines were addressed.  The following changes have been made:  restart ASA  Labs/ tests ordered today include:  No orders of the defined types were placed in this encounter.   Recommend 150 minutes/week of aerobic exercise Low fat, low carb, high fiber diet recommended  Disposition:   FU in 1 year   Signed, Lance Muss, MD  01/20/2023 2:29 PM    [facility] [address] [place], [place], [place]  [address] Phone: [phone]; Fax: [phone]

## 2023-01-20 ENCOUNTER — Ambulatory Visit: Payer: PRIVATE HEALTH INSURANCE | Attending: Interventional Cardiology | Admitting: Interventional Cardiology

## 2023-01-20 ENCOUNTER — Encounter: Payer: Self-pay | Admitting: Interventional Cardiology

## 2023-01-20 ENCOUNTER — Encounter: Payer: Self-pay | Admitting: *Deleted

## 2023-01-20 VITALS — BP 110/60 | HR 55 | Ht 74.0 in | Wt 181.2 lb

## 2023-01-20 DIAGNOSIS — R001 Bradycardia, unspecified: Secondary | ICD-10-CM

## 2023-01-20 DIAGNOSIS — I2584 Coronary atherosclerosis due to calcified coronary lesion: Secondary | ICD-10-CM | POA: Diagnosis not present

## 2023-01-20 DIAGNOSIS — I2510 Atherosclerotic heart disease of native coronary artery without angina pectoris: Secondary | ICD-10-CM

## 2023-01-20 DIAGNOSIS — E782 Mixed hyperlipidemia: Secondary | ICD-10-CM | POA: Diagnosis not present

## 2023-01-20 NOTE — Patient Instructions (Signed)
Medication Instructions:  Your physician recommends that you continue on your current medications as directed. Please refer to the Current Medication list given to you today.  *If you need a refill on your cardiac medications before your next appointment, please call your pharmacy*   Lab Work: none If you have labs (blood work) drawn today and your tests are completely normal, you will receive your results only by: MyChart Message (if you have MyChart) OR A paper copy in the mail If you have any lab test that is abnormal or we need to change your treatment, we will call you to review the results.   Testing/Procedures: Your physician has requested that you have an exercise stress myoview. For further information please visit [email or web address]. Please follow instruction sheet, as given.    Follow-Up: At [facility], you and your health needs are our priority.  As part of our continuing mission to provide you with exceptional heart care, we have created designated Provider Care Teams.  These Care Teams include your primary Cardiologist (physician) and Advanced Practice Providers (APPs -  Physician Assistants and Nurse Practitioners) who all work together to provide you with the care you need, when you need it.  We recommend signing up for the patient portal called "MyChart".  Sign up information is provided on this After Visit Summary.  MyChart is used to connect with patients for Virtual Visits (Telemedicine).  Patients are able to view lab/test results, encounter notes, upcoming appointments, etc.  Non-urgent messages can be sent to your provider as well.   To learn more about what you can do with MyChart, go to [email or web address].    Your next appointment:   12 month(s)  Provider:   Jayadeep Varanasi, MD     Other Instructions    

## 2023-01-20 NOTE — Addendum Note (Signed)
Addended by: Thea Holshouser R on: 01/20/2023 05:13 PM   Modules accepted: Orders  

## 2023-01-27 ENCOUNTER — Encounter (HOSPITAL_COMMUNITY): Payer: Self-pay

## 2023-01-29 ENCOUNTER — Ambulatory Visit (HOSPITAL_COMMUNITY): Payer: PRIVATE HEALTH INSURANCE | Attending: Interventional Cardiology

## 2023-01-29 DIAGNOSIS — E782 Mixed hyperlipidemia: Secondary | ICD-10-CM | POA: Diagnosis not present

## 2023-01-29 DIAGNOSIS — I2584 Coronary atherosclerosis due to calcified coronary lesion: Secondary | ICD-10-CM | POA: Diagnosis not present

## 2023-01-29 DIAGNOSIS — I2510 Atherosclerotic heart disease of native coronary artery without angina pectoris: Secondary | ICD-10-CM | POA: Diagnosis not present

## 2023-01-29 LAB — MYOCARDIAL PERFUSION IMAGING
Angina Index: 0
Duke Treadmill Score: 11
Estimated workload: 12.5
Exercise duration (min): 10 min
Exercise duration (sec): 30 s
LV dias vol: 94 mL (ref 62–150)
LV sys vol: 41 mL
MPHR: 150 {beats}/min
Nuc Stress EF: 56 %
Peak HR: 137 {beats}/min
Percent HR: 91 %
Rest HR: 56 {beats}/min
Rest Nuclear Isotope Dose: 10 mCi
SDS: 1
SRS: 0
SSS: 1
ST Depression (mm): 0 mm
Stress Nuclear Isotope Dose: 30.3 mCi
TID: 0.92

## 2023-01-29 MED ORDER — TECHNETIUM TC 99M TETROFOSMIN IV KIT
10.0000 | PACK | Freq: Once | INTRAVENOUS | Status: AC | PRN
  Administered 2023-01-29: 10 via INTRAVENOUS

## 2023-01-29 MED ORDER — TECHNETIUM TC 99M TETROFOSMIN IV KIT
30.3000 | PACK | Freq: Once | INTRAVENOUS | Status: AC | PRN
  Administered 2023-01-29: 30.3 via INTRAVENOUS

## 2023-02-12 ENCOUNTER — Emergency Department (HOSPITAL_COMMUNITY)
Admission: EM | Admit: 2023-02-12 | Discharge: 2023-02-12 | Disposition: A | Discharge status: Home / Self Care | Payer: PRIVATE HEALTH INSURANCE | Attending: Emergency Medicine | Admitting: Emergency Medicine

## 2023-02-12 ENCOUNTER — Emergency Department (HOSPITAL_COMMUNITY): Payer: PRIVATE HEALTH INSURANCE

## 2023-02-12 ENCOUNTER — Encounter (HOSPITAL_COMMUNITY): Payer: Self-pay

## 2023-02-12 DIAGNOSIS — H6123 Impacted cerumen, bilateral: Secondary | ICD-10-CM | POA: Insufficient documentation

## 2023-02-12 DIAGNOSIS — H538 Other visual disturbances: Secondary | ICD-10-CM | POA: Insufficient documentation

## 2023-02-12 DIAGNOSIS — R519 Headache, unspecified: Secondary | ICD-10-CM | POA: Diagnosis not present

## 2023-02-12 DIAGNOSIS — I2510 Atherosclerotic heart disease of native coronary artery without angina pectoris: Secondary | ICD-10-CM | POA: Diagnosis not present

## 2023-02-12 HISTORY — DX: Hyperlipidemia, unspecified: E78.5

## 2023-02-12 LAB — COMPREHENSIVE METABOLIC PANEL
ALT: 23 U/L (ref 0–44)
AST: 22 U/L (ref 15–41)
Albumin: 3.3 g/dL — ABNORMAL LOW (ref 3.5–5.0)
Alkaline Phosphatase: 73 U/L (ref 38–126)
Anion gap: 7 (ref 5–15)
BUN: 17 mg/dL (ref 8–23)
CO2: 24 mmol/L (ref 22–32)
Calcium: 8.6 mg/dL — ABNORMAL LOW (ref 8.9–10.3)
Chloride: 107 mmol/L (ref 98–111)
Creatinine, Ser: 0.69 mg/dL (ref 0.61–1.24)
GFR, Estimated: >60 mL/min (ref >60)
Glucose, Bld: 112 mg/dL — ABNORMAL HIGH (ref 70–99)
Potassium: 4.4 mmol/L (ref 3.5–5.1)
Sodium: 138 mmol/L (ref 135–145)
Total Bilirubin: 0.7 mg/dL (ref 0.3–1.2)
Total Protein: 6.7 g/dL (ref 6.5–8.1)

## 2023-02-12 LAB — CBC WITH DIFFERENTIAL/PLATELET
Abs Immature Granulocytes: 0.01 10*3/uL (ref 0.00–0.07)
Basophils Absolute: 0 10*3/uL (ref 0.0–0.1)
Basophils Relative: 1 %
Eosinophils Absolute: 0.1 10*3/uL (ref 0.0–0.5)
Eosinophils Relative: 2 %
HCT: 42 % (ref 39.0–52.0)
Hemoglobin: 14.2 g/dL (ref 13.0–17.0)
Immature Granulocytes: 0 %
Lymphocytes Relative: 30 %
Lymphs Abs: 1.8 10*3/uL (ref 0.7–4.0)
MCH: 31.3 pg (ref 26.0–34.0)
MCHC: 33.8 g/dL (ref 30.0–36.0)
MCV: 92.7 fL (ref 80.0–100.0)
Monocytes Absolute: 0.5 10*3/uL (ref 0.1–1.0)
Monocytes Relative: 8 %
Neutro Abs: 3.5 10*3/uL (ref 1.7–7.7)
Neutrophils Relative %: 59 %
Platelets: 187 10*3/uL (ref 150–400)
RBC: 4.53 MIL/uL (ref 4.22–5.81)
RDW: 12.2 % (ref 11.5–15.5)
WBC: 5.9 10*3/uL (ref 4.0–10.5)
nRBC: 0 % (ref 0.0–0.2)

## 2023-02-12 LAB — URINALYSIS, ROUTINE W REFLEX MICROSCOPIC
Bilirubin Urine: NEGATIVE
Glucose, UA: NEGATIVE mg/dL
Hgb urine dipstick: NEGATIVE
Ketones, ur: NEGATIVE mg/dL
Leukocytes,Ua: NEGATIVE
Nitrite: NEGATIVE
Protein, ur: NEGATIVE mg/dL
Specific Gravity, Urine: 1.008 (ref 1.005–1.030)
pH: 6 (ref 5.0–8.0)

## 2023-02-12 LAB — SEDIMENTATION RATE: Sed Rate: 9 mm/hr (ref 0–16)

## 2023-02-12 LAB — PROTIME-INR
INR: 0.9 (ref 0.8–1.2)
Prothrombin Time: 12.2 seconds (ref 11.4–15.2)

## 2023-02-12 LAB — APTT: aPTT: 25 seconds (ref 24–36)

## 2023-02-12 LAB — MAGNESIUM: Magnesium: 2.1 mg/dL (ref 1.7–2.4)

## 2023-02-12 MED ORDER — FLUORESCEIN SODIUM 1 MG OP STRP
1.0000 | ORAL_STRIP | Freq: Once | OPHTHALMIC | Status: AC
  Administered 2023-02-12: 1 via OPHTHALMIC
  Filled 2023-02-12: qty 1

## 2023-02-12 MED ORDER — TETRACAINE HCL 0.5 % OP SOLN
2.0000 [drp] | Freq: Once | OPHTHALMIC | Status: AC
  Administered 2023-02-12: 2 [drp] via OPHTHALMIC
  Filled 2023-02-12: qty 4

## 2023-02-12 NOTE — ED Triage Notes (Addendum)
Pt arrived POV reporting blurred vision to right eye since 9 pm last night, pt has corrected lens and still reports having some blurred vision to the right eye and slightly to the left eye. Pt reports right eye slightly painful. Also experienced URI for the past week, did not seek treatment and also headache for 3 days to fontal area. Pt reports no weakness or deficits. A&O x4. Ambulatory to triage with steady gait. VSS, NAD noted.

## 2023-02-12 NOTE — ED Provider Notes (Signed)
[place] EMERGENCY DEPARTMENT AT [facility] Provider Note  CSN: [id number] Arrival date & time: 02/12/23 0534  Chief Complaint(s) Blurred Vision  HPI Joshua Jarvis is a 72 y.o. male with past medical history as below, significant for HLD, Raynaud's disease, Barrett's esophagus, CAD who presents to the ED with complaint of blurry vision.  Onset of symptoms last night around 9 PM.  Feels vision blurry to right eye, he does wear corrective lenses. No diplopia. No numbness, weakness, gait disturbance, speech or language difficulties. No hx similar symptoms. Does report recent URI, no fevers but did have some post nasal drip and sinus congestion. Having pain to forehead primarily on the right and somewhat on the left frontal/temporal area. Some pain with eye movement primarily laterally to orbits to both eyes. He has mild headache/frontal over last few days a/w URI/sinus pressure. No recent diet/medication changes, no recent trauma. No medications PTA. Symptoms unchanged since not onset.   Past Medical History Past Medical History:  Diagnosis Date   Erectile dysfunction    GERD (gastroesophageal reflux disease)    Hypercholesterolemia    Hyperlipidemia    Monoarthritis of foot, right    Plantar fasciitis    Raynauds disease    3 EPISODES FIRST MTP ARTHRITIS, LIKELY GOUT   Vitamin D deficiency    Patient Active Problem List   Diagnosis Date Noted   Erectile dysfunction 12/13/2020   Hypercholesterolemia 12/13/2020   Monoarthritis of right foot 12/13/2020   Vitamin D deficiency 12/13/2020   Barrett's esophagus without dysplasia 02/03/2019   History of colon polyps 02/03/2019   CAD (coronary artery disease) 12/15/2018   Home Medication(s) Prior to Admission medications   Medication Sig Start Date End Date Taking? Authorizing Provider  aspirin EC 81 MG tablet Take 81 mg by mouth daily. Patient not taking: Reported on 01/20/2023    [provider]  atorvastatin  (LIPITOR) 40 MG tablet Take 40 mg by mouth daily.    [provider]  cholecalciferol (VITAMIN D3) 25 MCG (1000 UT) tablet Take 1,000 Units by mouth daily.    [provider]  Multiple Vitamin (MULTIVITAMIN) capsule Take 1 capsule by mouth daily.    [provider]  NEOMYCIN-POLYMYXIN-HYDROCORTISONE (CORTISPORIN) 1 % SOLN OTIC solution Apply 1-2 drops to toe BID after soaking Patient not taking: Reported on 01/20/2023 12/13/20   Hyatt, Max T, DPM  omeprazole (PRILOSEC) 20 MG capsule Take 20 mg by mouth 2 (two) times daily.    [provider]  sildenafil (REVATIO) 20 MG tablet Take 20 mg by mouth as needed (take 2-5 tabs daily as needed).    [provider]                                                                                                                                    Past Surgical History Past Surgical History:  Procedure Laterality Date   HERNIA REPAIR  TONSILLECTOMY     Family History Family History  Problem Relation Age of Onset   Heart failure Mother        AFib   Atrial fibrillation Father     Social History Social History   Tobacco Use   Smoking status: Never   Smokeless tobacco: Never  Vaping Use   Vaping Use: Never used  Substance Use Topics   Alcohol use: Yes   Drug use: No   Allergies Patient has no known allergies.  Review of Systems Review of Systems  Constitutional:  Negative for chills and fever.  HENT:  Positive for congestion and postnasal drip. Negative for facial swelling and trouble swallowing.   Eyes:  Positive for photophobia and visual disturbance.  Respiratory:  Negative for cough and shortness of breath.   Cardiovascular:  Negative for chest pain and palpitations.  Gastrointestinal:  Negative for abdominal pain, nausea and vomiting.  Endocrine: Negative for polydipsia and polyuria.  Genitourinary:  Negative for difficulty urinating and hematuria.  Musculoskeletal:  Negative for gait  problem and joint swelling.  Skin:  Negative for pallor and rash.  Neurological:  Positive for headaches. Negative for syncope.  Psychiatric/Behavioral:  Negative for agitation and confusion.     Physical Exam Vital Signs  I have reviewed the triage vital signs BP (!) 141/78   Pulse (!) 58   Temp 97.6 F (36.4 C) (Oral)   Resp 18   Ht 6\' 2"  (1.88 m)   Wt 81.6 kg   SpO2 97%   BMI 23.11 kg/m  Physical Exam Vitals and nursing note reviewed.  Constitutional:      General: He is not in acute distress.    Appearance: Normal appearance. He is well-developed. He is not ill-appearing or diaphoretic.  HENT:     Head: Normocephalic and atraumatic.     Jaw: There is normal jaw occlusion. No trismus or swelling.      Comments: No sig pain with bitemporal percussion     Right Ear: External ear normal.     Left Ear: External ear normal.     Ears:     Comments: Cerumen noted bilaterally No vesicular lesions      Mouth/Throat:     Mouth: Mucous membranes are moist.  Eyes:     General: Lids are normal. Vision grossly intact. No scleral icterus.       Right eye: No discharge.        Left eye: No discharge.     Extraocular Movements: Extraocular movements intact.     Right eye: Normal extraocular motion.     Left eye: Normal extraocular motion.     Conjunctiva/sclera: Conjunctivae normal.     Right eye: Right conjunctiva is not injected.     Left eye: Left conjunctiva is not injected.     Pupils: Pupils are equal, round, and reactive to light. Pupils are equal.     Right eye: Pupil is round and reactive. No corneal abrasion or fluorescein uptake. Seidel exam negative.     Left eye: Pupil is round and reactive. No corneal abrasion or fluorescein uptake. Seidel exam negative. Cardiovascular:     Rate and Rhythm: Normal rate and regular rhythm.     Pulses: Normal pulses.  Pulmonary:     Effort: Pulmonary effort is normal. No respiratory distress.     Breath sounds: Normal breath  sounds.  Abdominal:     General: Abdomen is flat.     Palpations: Abdomen is soft.  Tenderness: There is no abdominal tenderness.  Musculoskeletal:     Cervical back: No rigidity.     Right lower leg: No edema.     Left lower leg: No edema.  Skin:    General: Skin is warm and dry.     Capillary Refill: Capillary refill takes less than 2 seconds.  Neurological:     Mental Status: He is alert and oriented to person, place, and time.     GCS: GCS eye subscore is 4. GCS verbal subscore is 5. GCS motor subscore is 6.     Cranial Nerves: Cranial nerves 2-12 are intact. No dysarthria or facial asymmetry.     Sensory: Sensation is intact. No sensory deficit.     Motor: Motor function is intact. No weakness, tremor or pronator drift.     Coordination: Coordination is intact. Finger-Nose-Finger Test normal.     Gait: Gait is intact.     Comments: Strength 5/5 BLUE BLLE No drift on exam No facial asymmetry    Psychiatric:        Mood and Affect: Mood normal.        Behavior: Behavior normal.     ED Results and Treatments Labs (all labs ordered are listed, but only abnormal results are displayed) Labs Reviewed  COMPREHENSIVE METABOLIC PANEL - Abnormal; Notable for the following components:      Result Value   Glucose, Bld 112 (*)    Calcium 8.6 (*)    Albumin 3.3 (*)    All other components within normal limits  CBC WITH DIFFERENTIAL/PLATELET  MAGNESIUM  PROTIME-INR  APTT  URINALYSIS, ROUTINE W REFLEX MICROSCOPIC  SEDIMENTATION RATE                                                                                                                          Radiology No results found.  Pertinent labs & imaging results that were available during my care of the patient were reviewed by me and considered in my medical decision making (see MDM for details).  Medications Ordered in ED Medications  tetracaine (PONTOCAINE) 0.5 % ophthalmic solution 2 drop (2 drops Right Eye Given by  Other 02/12/23 1610)  fluorescein ophthalmic strip 1 strip (1 strip Right Eye Given by Other 02/12/23 9604)  Procedures Procedures  (including critical care time)  Medical Decision Making / ED Course    Medical Decision Making:    DAMANI KELEMEN is a 72 y.o. male with past medical history as below, significant for HLD, Raynaud's disease, Barrett's esophagus, CAD who presents to the ED with complaint of blurry vision. . The complaint involves an extensive differential diagnosis and also carries with it a high risk of complications and morbidity.  Serious etiology was considered. Ddx includes but is not limited to: sinus headache, orbital cellulitis, temporal arteritis, CRAO, CRVO, uveitis, acute angle closure glaucoma , retinal detachment, vitreous hemorrhage, cva, etc  Differential diagnosis includes but is not exclusive to subarachnoid hemorrhage, meningitis, encephalitis, previous head trauma, cavernous venous thrombosis, muscle tension headache, glaucoma, temporal arteritis, migraine or migraine equivalent, etc.   Complete initial physical exam performed, notably the patient  was NAD, neuro non-focal.    Reviewed and confirmed nursing documentation for past medical history, family history, social history.  Vital signs reviewed.    Clinical Course as of 02/12/23 0730  Thu Feb 12, 2023  0981 LKN 2100 02/12/23, no focal deficits on exam, not TNK candidate, VAN negative  [SG]    Clinical Course User Index [SG] Tanda Rockers A, DO   Pt here with monocular vision change a/w headache, uri s/s. Neuro exam is non-focal, ocular exam is stable, visual acuity completed (vision grossly intact). No obvious abrasion or dendritic lesions noted to eye on ocular exam. No sig ttp with bitemporal palpation. CT imaging pending, would recommend ophtho consult following CT imaging.  He is stable currently, neuro exam remains non-focal. CVA considered but suspicion is low.   Signed out to incoming EDP at shift change pending imaging/ophtho consult. Dr Rubin Payor assuming care.      Additional history obtained: -Additional history obtained from spouse -External records from outside source obtained and reviewed including: Chart review including previous notes, labs, imaging, consultation notes including primary care documentation, home meds, prior labs/imaging    Lab Tests: -I ordered, reviewed, and interpreted labs.   The pertinent results include:   Labs Reviewed  COMPREHENSIVE METABOLIC PANEL - Abnormal; Notable for the following components:      Result Value   Glucose, Bld 112 (*)    Calcium 8.6 (*)    Albumin 3.3 (*)    All other components within normal limits  CBC WITH DIFFERENTIAL/PLATELET  MAGNESIUM  PROTIME-INR  APTT  URINALYSIS, ROUTINE W REFLEX MICROSCOPIC  SEDIMENTATION RATE    Notable for as above  EKG   EKG Interpretation  Date/Time:  Thursday Feb 12 2023 07:04:49 EDT Ventricular Rate:  46 PR Interval:  197 QRS Duration: 103 QT Interval:  456 QTC Calculation: 399 R Axis:   13 Text Interpretation: Sinus bradycardia Left ventricular hypertrophy Borderline T abnormalities, inferior leads no prior tracing available to compare Confirmed by Tanda Rockers ([id number]) on 02/12/2023 7:22:21 AM         Imaging Studies ordered: I ordered imaging studies including CTH I independently visualized the following imaging with scope of interpretation limited to determining acute life threatening conditions related to emergency care; findings noted above, significant for pending at shift change I independently visualized and interpreted imaging. I agree with the radiologist interpretation   Medicines ordered and prescription drug management: Meds ordered this encounter  Medications   tetracaine (PONTOCAINE) 0.5 % ophthalmic solution 2 drop   fluorescein  ophthalmic strip 1 strip    -I have reviewed the patients home medicines and have  made adjustments as needed   Consultations Obtained: a   Cardiac Monitoring: NSR  Social Determinants of Health:  Diagnosis or treatment significantly limited by social determinants of health: non smoker    Reevaluation: After the interventions noted above, I reevaluated the patient and found that they have stayed the same  Co morbidities that complicate the patient evaluation  Past Medical History:  Diagnosis Date   Erectile dysfunction    GERD (gastroesophageal reflux disease)    Hypercholesterolemia    Hyperlipidemia    Monoarthritis of foot, right    Plantar fasciitis    Raynauds disease    3 EPISODES FIRST MTP ARTHRITIS, LIKELY GOUT   Vitamin D deficiency       Dispostion: Disposition decision including need for hospitalization was considered, and patient disposition pending at time of sign out.    Final Clinical Impression(s) / ED Diagnoses Final diagnoses:  Nonintractable headache, unspecified chronicity pattern, unspecified headache type  Blurry vision, right eye     This chart was dictated using voice recognition software.  Despite best efforts to proofread,  errors can occur which can change the documentation meaning.    Sloan Leiter, DO 02/12/23 [phone]

## 2023-02-12 NOTE — ED Notes (Signed)
Patient transported to CT 

## 2023-02-13 DIAGNOSIS — H469 Unspecified optic neuritis: Secondary | ICD-10-CM

## 2023-02-13 HISTORY — DX: Unspecified optic neuritis: H46.9

## 2023-09-13 HISTORY — PX: OTHER SURGICAL HISTORY: SHX169

## 2024-03-04 ENCOUNTER — Other Ambulatory Visit: Payer: Self-pay | Admitting: Orthopaedic Surgery

## 2024-03-04 NOTE — Progress Notes (Signed)
 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  He has been having lower extremity pain for about 8 weeks since increasing his running activities.  It has been a type of pain he described around the IT band and some of the hamstring.  He has had not much discomfort in terms of groin pain and has been very active.  He said he was apprised with the MRI findings of his prostate showing something going on with his hip.  The imaging studies are not available for me to review as of yet but I am able to see the MRI report that shows edema in his femoral head and neck and neck suggest in 1 view a nondisplaced neck fracture but it is not definitive.  I have instructed Dr. Hobart Lulas to ambulate with a cane in his opposite hand for now and avoid any high-impact activities so we can further image his hip in the near future.

## 2024-03-09 ENCOUNTER — Ambulatory Visit: Payer: PRIVATE HEALTH INSURANCE | Admitting: Orthopaedic Surgery

## 2024-03-09 ENCOUNTER — Other Ambulatory Visit (INDEPENDENT_AMBULATORY_CARE_PROVIDER_SITE_OTHER): Payer: PRIVATE HEALTH INSURANCE

## 2024-03-09 DIAGNOSIS — M25551 Pain in right hip: Secondary | ICD-10-CM

## 2024-03-09 NOTE — Progress Notes (Signed)
 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  He has been having worsening hip pain that is rapidly gotten worse over the last 8 weeks.  This happened after increasing running as exercise.  He had a recent MRI of his prostate and there was concerning findings in part of the right hip that can be seen with questioning AVN or even a potential fracture.  I have had him offload his hip with using crutches.  He does report some pain with pivoting in that hip and some groin pain.  This has been new in onset.  He denies any left hip pain.  On exam his left and right hip moves smoothly and fluidly with no blocks rotation to the right hip does hurt on the extremes of internal and external rotation and that pain is in the groin.  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  There is slight changes below the articular surface in the femoral head which is concerning on my read.  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  In the interim he will continue to offload that hip is much as possible and no high impact aerobic activities.  I am fine with him operating and seeing patients.  Once we have the MRI study we can go from there in terms of treatment options and a plan.  He agrees with this as well.

## 2024-03-10 ENCOUNTER — Encounter: Payer: Self-pay | Admitting: Orthopaedic Surgery

## 2024-03-10 ENCOUNTER — Other Ambulatory Visit: Payer: Self-pay

## 2024-03-10 DIAGNOSIS — M25551 Pain in right hip: Secondary | ICD-10-CM

## 2024-03-11 ENCOUNTER — Ambulatory Visit
Admission: RE | Admit: 2024-03-11 | Discharge: 2024-03-11 | Disposition: A | Discharge status: Home / Self Care | Payer: PRIVATE HEALTH INSURANCE | Source: Ambulatory Visit | Attending: Orthopaedic Surgery | Admitting: Orthopaedic Surgery

## 2024-03-11 DIAGNOSIS — M25551 Pain in right hip: Secondary | ICD-10-CM

## 2024-03-31 ENCOUNTER — Other Ambulatory Visit: Payer: Self-pay | Admitting: Physician Assistant

## 2024-03-31 DIAGNOSIS — Z01818 Encounter for other preprocedural examination: Secondary | ICD-10-CM

## 2024-04-07 NOTE — Progress Notes (Signed)
 Surgical Instructions   Your procedure is scheduled on Tuesday, Juy 8th, 2025. Report to [facility] Main Entrance A at 5:30 A.M., then check in with the Admitting office. Any questions or running late day of surgery: call [phone]  Questions prior to your surgery date: call [phone], Monday-Friday, 8am-4pm. If you experience any cold or flu symptoms such as cough, fever, chills, shortness of breath, etc. between now and your scheduled surgery, please notify us  at the above number.     Remember:  Do not eat after midnight the night before your surgery  You may drink clear liquids until 4:30 the morning of your surgery.   Clear liquids allowed are: Water, Non-Citrus Juices (without pulp), Carbonated Beverages, Clear Tea (no milk, honey, etc.), Black Coffee Only (NO MILK, CREAM OR POWDERED CREAMER of any kind), and Gatorade.  Patient Instructions  The night before surgery:  No food after midnight. ONLY clear liquids after midnight  The day of surgery (if you do NOT have diabetes):  Drink ONE (1) Pre-Surgery Clear Ensure by 4:30 the morning of surgery. Drink in one sitting. Do not sip.  This drink was given to you during your hospital  pre-op appointment visit.  Nothing else to drink after completing the  Pre-Surgery Clear Ensure.          If you have questions, please contact your surgeon's office.     Take these medicines the morning of surgery with A SIP OF WATER: Atorvastatin (Lipitor) Omeprazole (Prilosec)   May take these medicines IF NEEDED: none.   Follow your surgeon's instructions on when to stop your Aspirin.  If no instructions received, reach out to your surgeon's office.   One week prior to surgery, STOP taking any Aspirin (unless otherwise instructed by your surgeon) Aleve, Naproxen, Ibuprofen, Motrin, Advil, Goody's, BC's, all herbal medications, fish oil, and non-prescription vitamins.                     Do NOT Smoke (Tobacco/Vaping) for 24 hours  prior to your procedure.  If you use a CPAP at night, you may bring your mask/headgear for your overnight stay.   You will be asked to remove any contacts, glasses, piercing's, hearing aid's, dentures/partials prior to surgery. Please bring cases for these items if needed.    Patients discharged the day of surgery will not be allowed to drive home, and someone needs to stay with them for 24 hours.  SURGICAL WAITING ROOM VISITATION Patients may have no more than 2 support people in the waiting area - these visitors may rotate.   Pre-op nurse will coordinate an appropriate time for 1 ADULT support person, who may not rotate, to accompany patient in pre-op.  Children under the age of 37 must have an adult with them who is not the patient and must remain in the main waiting area with an adult.  If the patient needs to stay at the hospital during part of their recovery, the visitor guidelines for inpatient rooms apply.  Please refer to the [facility] website for the visitor guidelines for any additional information.   If you received a COVID test during your pre-op visit  it is requested that you wear a mask when out in public, stay away from anyone that may not be feeling well and notify your surgeon if you develop symptoms. If you have been in contact with anyone that has tested positive in the last 10 days please notify you surgeon.  Pre-operative 5 CHG Bathing Instructions   You can play a key role in reducing the risk of infection after surgery. Your skin needs to be as free of germs as possible. You can reduce the number of germs on your skin by washing with CHG (chlorhexidine gluconate) soap before surgery. CHG is an antiseptic soap that kills germs and continues to kill germs even after washing.   DO NOT use if you have an allergy to chlorhexidine/CHG or antibacterial soaps. If your skin becomes reddened or irritated, stop using the CHG and notify one of our RNs at [phone].    Please shower with the CHG soap starting 4 days before surgery using the following schedule:     Please keep in mind the following:  DO NOT shave, including legs and underarms, starting the day of your first shower.   You may shave your face at any point before/day of surgery.  Place clean sheets on your bed the day you start using CHG soap. Use a clean washcloth (not used since being washed) for each shower. DO NOT sleep with pets once you start using the CHG.   CHG Shower Instructions:  Wash your face and private area with normal soap. If you choose to wash your hair, wash first with your normal shampoo.  After you use shampoo/soap, rinse your hair and body thoroughly to remove shampoo/soap residue.  Turn the water OFF and apply about 3 tablespoons (45 ml) of CHG soap to a CLEAN washcloth.  Apply CHG soap ONLY FROM YOUR NECK DOWN TO YOUR TOES (washing for 3-5 minutes)  DO NOT use CHG soap on face, private areas, open wounds, or sores.  Pay special attention to the area where your surgery is being performed.  If you are having back surgery, having someone wash your back for you may be helpful. Wait 2 minutes after CHG soap is applied, then you may rinse off the CHG soap.  Pat dry with a clean towel  Put on clean clothes/pajamas   If you choose to wear lotion, please use ONLY the CHG-compatible lotions that are listed below.  Additional instructions for the day of surgery: DO NOT APPLY any lotions, deodorants, cologne, or perfumes.   Do not bring valuables to the hospital. [facility] is not responsible for any belongings/valuables. Do not wear nail polish, gel polish, artificial nails, or any other type of covering on natural nails (fingers and toes) Do not wear jewelry or makeup Put on clean/comfortable clothes.  Please brush your teeth.  Ask your nurse before applying any prescription medications to the skin.     CHG Compatible Lotions   Aveeno Moisturizing lotion   Cetaphil Moisturizing Cream  Cetaphil Moisturizing Lotion  Clairol Herbal Essence Moisturizing Lotion, Dry Skin  Clairol Herbal Essence Moisturizing Lotion, Extra Dry Skin  Clairol Herbal Essence Moisturizing Lotion, Normal Skin  Curel Age Defying Therapeutic Moisturizing Lotion with Alpha Hydroxy  Curel Extreme Care Body Lotion  Curel Soothing Hands Moisturizing Hand Lotion  Curel Therapeutic Moisturizing Cream, Fragrance-Free  Curel Therapeutic Moisturizing Lotion, Fragrance-Free  Curel Therapeutic Moisturizing Lotion, Original Formula  Eucerin Daily Replenishing Lotion  Eucerin Dry Skin Therapy Plus Alpha Hydroxy Crme  Eucerin Dry Skin Therapy Plus Alpha Hydroxy Lotion  Eucerin Original Crme  Eucerin Original Lotion  Eucerin Plus Crme Eucerin Plus Lotion  Eucerin TriLipid Replenishing Lotion  Keri Anti-Bacterial Hand Lotion  Keri Deep Conditioning Original Lotion Dry Skin Formula Softly Scented  Keri Deep Conditioning Original Lotion, Fragrance Free Sensitive  Skin Formula  Keri Lotion Fast Absorbing Fragrance Free Sensitive Skin Formula  Keri Lotion Fast Absorbing Softly Scented Dry Skin Formula  Keri Original Lotion  Keri Skin Renewal Lotion Keri Silky Smooth Lotion  Keri Silky Smooth Sensitive Skin Lotion  Nivea Body Creamy Conditioning Oil  Nivea Body Extra Enriched Lotion  Nivea Body Original Lotion  Nivea Body Sheer Moisturizing Lotion Nivea Crme  Nivea Skin Firming Lotion  NutraDerm 30 Skin Lotion  NutraDerm Skin Lotion  NutraDerm Therapeutic Skin Cream  NutraDerm Therapeutic Skin Lotion  ProShield Protective Hand Cream  Provon moisturizing lotion  Please read over the following fact sheets that you were given.

## 2024-04-08 ENCOUNTER — Encounter (HOSPITAL_COMMUNITY)
Admission: RE | Admit: 2024-04-08 | Discharge: 2024-04-08 | Disposition: A | Discharge status: Home / Self Care | Payer: PRIVATE HEALTH INSURANCE | Source: Ambulatory Visit | Attending: Orthopaedic Surgery | Admitting: Orthopaedic Surgery

## 2024-04-08 ENCOUNTER — Encounter (HOSPITAL_COMMUNITY): Payer: Self-pay

## 2024-04-08 ENCOUNTER — Other Ambulatory Visit: Payer: Self-pay

## 2024-04-08 VITALS — BP 130/81 | HR 49 | Temp 97.7°F | Resp 20 | Ht 74.0 in | Wt 180.7 lb

## 2024-04-08 DIAGNOSIS — M879 Osteonecrosis, unspecified: Secondary | ICD-10-CM | POA: Diagnosis not present

## 2024-04-08 DIAGNOSIS — R001 Bradycardia, unspecified: Secondary | ICD-10-CM | POA: Diagnosis not present

## 2024-04-08 DIAGNOSIS — Z01812 Encounter for preprocedural laboratory examination: Secondary | ICD-10-CM | POA: Diagnosis present

## 2024-04-08 DIAGNOSIS — M87051 Idiopathic aseptic necrosis of right femur: Secondary | ICD-10-CM | POA: Insufficient documentation

## 2024-04-08 DIAGNOSIS — I7300 Raynaud's syndrome without gangrene: Secondary | ICD-10-CM | POA: Diagnosis not present

## 2024-04-08 DIAGNOSIS — E785 Hyperlipidemia, unspecified: Secondary | ICD-10-CM | POA: Insufficient documentation

## 2024-04-08 DIAGNOSIS — R1030 Lower abdominal pain, unspecified: Secondary | ICD-10-CM | POA: Diagnosis not present

## 2024-04-08 DIAGNOSIS — Z01818 Encounter for other preprocedural examination: Secondary | ICD-10-CM

## 2024-04-08 DIAGNOSIS — M25451 Effusion, right hip: Secondary | ICD-10-CM | POA: Insufficient documentation

## 2024-04-08 DIAGNOSIS — K219 Gastro-esophageal reflux disease without esophagitis: Secondary | ICD-10-CM | POA: Diagnosis not present

## 2024-04-08 HISTORY — DX: Bradycardia, unspecified: R00.1

## 2024-04-08 HISTORY — DX: Family history of other specified conditions: Z84.89

## 2024-04-08 HISTORY — DX: Malignant neoplasm of prostate: C61

## 2024-04-08 LAB — BASIC METABOLIC PANEL WITH GFR
Anion gap: 8 (ref 5–15)
BUN: 12 mg/dL (ref 8–23)
CO2: 28 mmol/L (ref 22–32)
Calcium: 9 mg/dL (ref 8.9–10.3)
Chloride: 103 mmol/L (ref 98–111)
Creatinine, Ser: 0.87 mg/dL (ref 0.61–1.24)
GFR, Estimated: >60 mL/min (ref >60)
Glucose, Bld: 93 mg/dL (ref 70–99)
Potassium: 4.2 mmol/L (ref 3.5–5.1)
Sodium: 139 mmol/L (ref 135–145)

## 2024-04-08 LAB — TYPE AND SCREEN
ABO/RH(D): O POS
Antibody Screen: NEGATIVE

## 2024-04-08 LAB — CBC
HCT: 44.4 % (ref 39.0–52.0)
Hemoglobin: 14.9 g/dL (ref 13.0–17.0)
MCH: 30.3 pg (ref 26.0–34.0)
MCHC: 33.6 g/dL (ref 30.0–36.0)
MCV: 90.4 fL (ref 80.0–100.0)
Platelets: 205 10*3/uL (ref 150–400)
RBC: 4.91 MIL/uL (ref 4.22–5.81)
RDW: 12.9 % (ref 11.5–15.5)
WBC: 5.1 10*3/uL (ref 4.0–10.5)
nRBC: 0 % (ref 0.0–0.2)

## 2024-04-08 LAB — SURGICAL PCR SCREEN
MRSA, PCR: NEGATIVE
Staphylococcus aureus: NEGATIVE

## 2024-04-08 NOTE — Progress Notes (Signed)
 PCP - Dr. Dorn Sauers Cardiologist - Pt saw Dr. Dann 01/20/23 but says he is not supposed to f/u, no issues  PPM/ICD - denies   Chest x-ray - denies EKG - 02/12/23 (n/a) Stress Test - 01/29/23 ECHO - denies Cardiac Cath - denies  Sleep Study - denies   DM- denies  Last dose of GLP1 agonist-  n/a   Blood Thinner Instructions: n/a Aspirin Instructions: f/u with surgeon  ERAS Protcol - clears until 0430 PRE-SURGERY Ensure given  COVID TEST- n/a   Anesthesia review: yes, has seen cardiology previously, bradycardia  Patient denies shortness of breath, fever, cough and chest pain at PAT appointment   All instructions explained to the patient, with a verbal understanding of the material. Patient agrees to go over the instructions while at home for a better understanding.  The opportunity to ask questions was provided.

## 2024-04-11 ENCOUNTER — Encounter (HOSPITAL_COMMUNITY): Payer: Self-pay

## 2024-04-11 NOTE — Progress Notes (Signed)
 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 Reported father had significant bradycardia once with anesthesia but not significant issues with subsequent procedures.   For 10/13/23 [facility] anesthesia airway documentation, 2 attempts to place 7.5 ETT; DL with MAC 4. Grade 3 view obtained. Switched to glidescope 3. Grade 1 view. Tube secured.   He was previously followed by former [facility] cardiologist Dann Beverage, MD. He was initially evaluated on 12/15/18 after he had an elevated coronary calcium score of 1149 (92nd percentile) on 11/16/18. He was active at the time using a treadmill with HR up to 150, mountain biking, swimming, and/or elliptical for exercise. He subsequently had an ETT on 12/24/18 that was negative (exercised 12.45 minutes, 16 METS). His last visit was on 01/20/23. He was still working as a Engineer, petroleum and was trying to exercise, although he felt his stamina had decreased. Although some natural slowing with age over time anticipated, he did order an exercise Myoview  which was done on 01/29/23 and considered low risk, no evidence of ischemia, EF 56%. He was not placed on a b-blocker due to  bradycardia. His understanding is that follow-up was only as needed.   Per Progress Note by Dr. Vernetta, Dr. [name] had had worsening hip pain that is rapidly gotten worse over the last 8 weeks. This happened after increasing running as exercise. He had a recent MRI of his prostate and there was concerning findings in part of the right hip that can be seen with questioning AVN or even a potential fracture. I have had him offload his hip with using crutches. He does report some pain with pivoting in that hip and some groin pain. This has been new in onset. He denies any left hip pain... He was advised to offload his right hip as much as possible and avoid high impact aerobic activities until a  dedicated MRI of the hip could be obtained. This was done on 03/11/24 with findings including bilateral chronic hip AVN with extensive marrow edema in the right femoral head, femoral neck, and extending into the proximal shaft, along with right hip effusion. Right THA recommended.   Dr. [name] had a history of elevated CAC and asymptomatic bradycardia (down to 40's). He is on statin therapy. He had a low risk stress test in April 2024. There is no EKG from his PAT visit, but EKG in May 2024 showed SB at 46 bpm, LVH, non-specific T wave abnormality, PR 197 ms. Will defer to anesthesiologist if updated EKG desired on day of surgery. Per Ortho notes, he was recently running for exercise until he developed worsening hip pain. Discussed with anesthesiologist Maryclare Cornet, MD.    VS: BP 130/81   Pulse (!) 49   Temp 36.5 C (Oral)  Resp 20   Ht 6' 2 (1.88 m)   Wt 82 kg   SpO2 99%   BMI 23.20 kg/m    PROVIDERS: Charlott Dorn LABOR, MD is PCP  Nicholaus Ingle, MD is urologist, last visit 02/11/2024. Abou Zeid, Lenn Shape, MD is neurologist   LABS: Labs reviewed: Acceptable for surgery. (all labs ordered are listed, but only abnormal results are displayed)  Labs Reviewed  SURGICAL PCR SCREEN  CBC   BASIC METABOLIC PANEL WITH GFR  TYPE AND SCREEN    IMAGES: MRI Right Hip 03/11/24: IMPRESSION: 1. Bilateral chronic hip avascular necrosis, with extensive marrow edema in the right femoral head, femoral neck, and extending into the proximal shaft, along with right hip effusion. Differential diagnosis includes acute on chronic infarct/AVN; stress injury; or less likely septic joint with femoral osteomyelitis. Correlate with any signs and symptoms of infection in determining whether arthrocentesis of the right hip is indicated. 2. Anterior superior right labral tear. 3. Mild edema in the proximal vastus intermedius muscle and trace edema in the medial portion of the abductor musculature bilaterally. 4. Mild spurring at the pubis with faint focus of accentuated T2 signal along the posteroinferior margin of the left pubic bone. 5. Benign prostatic hypertrophy. Prominent median lobe protrudes into the urinary bladder. Probable scarring in the left peripheral zone of the prostate gland.   MRI Pelvis 03/04/23 ([facility]): IMPRESSION: 1.  Similar to slightly increased size of the PI-RADS 4 lesion in the right posterior lateral peripheral zone at the level of the apex/mid gland junction.  2.  Expected interval posttreatment changes of left apical prostate ablation without residual disease.  3.  Abnormal extensive T2 hyperintense signal and enhancement of the right proximal femoral head and neck with possible nondisplaced fracture of the femoral neck. Recommend further evaluation with CT right hip.   MRI Brain 02/13/23 ([facility]): IMPRESSION:  1. No acute intracranial abnormality.  2.  Right greater than left optic nerve/disc edema and enhancement with perineural inflammatory stranding, compatible with bilateral optic neuritis/perineuritis. Differential considerations include autoimmune (particularly NMO or MOG), inflammatory (giant cell arteritis), paraneoplastic, infectious, and ischemic  etiologies.  - Admitted to [facility] 02/13/23-02/17/23 for bilateral optic neuritis with positive MOG titer, s/p prednisone taper and referral to neurology.   EKG:02/12/23: Sinus bradycardia at 46 bpm Left ventricular hypertrophy Borderline T abnormalities, inferior leads no prior tracing available to compare Confirmed by Elnor Savant ([id number]) on 02/12/2023 7:22:21 AM - PR 197 ms, negative T wave in III (also on 02-03-2023 tracing) and non-specific T wave flattening in aVF and V4-6 (new or more pronounced when compared to 02/03/23 tracing from [facility])   CV: Nuclear stress test 01/29/23:   The study is normal. The study is low risk.   No ST deviation was noted.   LV perfusion is abnormal. There is no evidence of ischemia. Defect 1: There is a small defect with mild reduction in uptake present in the basal anteroseptal location(s) that is fixed. There is normal wall motion in the defect area. Consistent with artifact.   Left ventricular function is normal. Nuclear stress EF: 56 %. The left ventricular ejection fraction is normal (55-65%). End diastolic cavity size is normal.   Prior study not available for comparison.   CT Cardiac Calcium Scoring 11/16/18: IMPRESSION: - The observed calcium score of 1149 is at the percentile 92 for subjects of the same age, gender and race/ethnicity who are free of clinical cardiovascular disease and treated diabetes. - No  acute or significant extracardiac abnormality    Past Medical History:  Diagnosis Date   Bradycardia    Elevated coronary artery calcium score 11/16/2018   Erectile dysfunction    Family history of adverse reaction to anesthesia    pt's father's HR dropped significantly under anesthesia once (was fine with other surgeries)- had underlying bradycardia   GERD (gastroesophageal reflux disease)    Hypercholesterolemia    Hyperlipidemia    Monoarthritis of foot, right    Optic neuritis 02/13/2023   in setting of + MOG Ab, s/p steroid,  IVIG   Plantar fasciitis    Prostate cancer (HCC)    Raynauds disease    3 EPISODES FIRST MTP ARTHRITIS, LIKELY GOUT   Vitamin D deficiency     Past Surgical History:  Procedure Laterality Date   BUNIONECTOMY  2005   HERNIA REPAIR     irreversible electroporation  09/2023   of prostate   TONSILLECTOMY      MEDICATIONS:  aspirin EC 81 MG tablet   atorvastatin (LIPITOR) 20 MG tablet   Calcium Citrate (CITRACAL PO)   Multiple Vitamin (MULTIVITAMIN) capsule   omeprazole (PRILOSEC) 20 MG capsule   No current facility-administered medications for this encounter.   Isaiah Ruder, PA-C Surgical Short Stay/Anesthesiology [facility] Phone [phone] [facility] Phone [phone] 04/11/2024 5:36 PM

## 2024-04-11 NOTE — Anesthesia Preprocedure Evaluation (Addendum)
 Anesthesia Evaluation  Patient identified by MRN, date of birth, ID band Patient awake    Reviewed: Allergy & Precautions, NPO status , Patient's Chart, lab work & pertinent test results  History of Anesthesia Complications Negative for: history of anesthetic complications  Airway Mallampati: II  TM Distance: >3 FB Neck ROM: Full    Dental  (+) Dental Advisory Given, Chipped   Pulmonary neg pulmonary ROS   Pulmonary exam normal        Cardiovascular + CAD  Normal cardiovascular exam   Nuclear stress test 01/29/23:   The study is normal. The study is low risk.   No ST deviation was noted.   LV perfusion is abnormal. There is no evidence of ischemia. Defect 1: There is a small defect with mild reduction in uptake present in the basal anteroseptal location(s) that is fixed. There is normal wall motion in the defect area. Consistent with artifact.   Left ventricular function is normal. Nuclear stress EF: 56 %. The left ventricular ejection fraction is normal (55-65%). End diastolic cavity size is normal.   Prior study not available for comparison.    Neuro/Psych negative neurological ROS  negative psych ROS   GI/Hepatic Neg liver ROS,GERD  Controlled and Medicated,,  Endo/Other  negative endocrine ROS    Renal/GU negative Renal ROS    Prostate cancer     Musculoskeletal  (+) Arthritis ,    Abdominal   Peds  Hematology negative hematology ROS (+)   Anesthesia Other Findings Raynaud's  Reproductive/Obstetrics                              Anesthesia Physical Anesthesia Plan  ASA: 2  Anesthesia Plan: Spinal   Post-op Pain Management: Tylenol  PO (pre-op)*   Induction:   PONV Risk Score and Plan: 1 and Treatment may vary due to age or medical condition and Propofol  infusion  Airway Management Planned: Natural Airway and Simple Face Mask  Additional Equipment: None  Intra-op  Plan:   Post-operative Plan:   Informed Consent: I have reviewed the patients History and Physical, chart, labs and discussed the procedure including the risks, benefits and alternatives for the proposed anesthesia with the patient or authorized representative who has indicated his/her understanding and acceptance.       Plan Discussed with: CRNA and Anesthesiologist  Anesthesia Plan Comments: (See PAT note  For 10/13/23 [facility] anesthesia airway documentation, 2 attempts to place 7.5 ETT; DL with MAC 4. Grade 3 view obtained. Switched to glidescope 3. Grade 1 view. Tube secured.)        Anesthesia Quick Evaluation

## 2024-04-18 DIAGNOSIS — M87051 Idiopathic aseptic necrosis of right femur: Principal | ICD-10-CM | POA: Insufficient documentation

## 2024-04-18 NOTE — H&P (Signed)
 TOTAL HIP ADMISSION H&P  Patient is admitted for right total hip arthroplasty.  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  Onset of symptoms was abrupt starting a few months ago with rapidlly worsening course since that time.The patient noted no past surgery on the right hip(s).  Patient currently rates pain in the right hip at 10 out of 10 with activity. Patient has night pain, worsening of pain with activity and weight bearing, pain that interfers with activities of daily living, and pain with passive range of motion. Patient has evidence of subchondral cysts and subchondral edema by imaging studies. This condition presents safety issues increasing the risk of falls. This patient has had avascular necrosis of the hip, acetabular fracture, hip dysplasia.  There is no current active infection.  Patient Active Problem List   Diagnosis Date Noted   Avascular necrosis of right hip (HCC) 04/18/2024   Erectile dysfunction 12/13/2020   Hypercholesterolemia 12/13/2020   Monoarthritis of right foot 12/13/2020   Vitamin D deficiency 12/13/2020   Barrett's esophagus without dysplasia 02/03/2019   History of colon polyps 02/03/2019   CAD (coronary artery disease) 12/15/2018   Past Medical History:  Diagnosis Date   Bradycardia    Elevated coronary artery calcium  score 11/16/2018   Erectile dysfunction    Family history of adverse reaction to anesthesia    pt's father's HR dropped significantly under anesthesia once (was fine with other surgeries)- had underlying bradycardia   GERD (gastroesophageal reflux disease)    Hypercholesterolemia    Hyperlipidemia    Monoarthritis of foot,  right    Optic neuritis 02/13/2023   in setting of + MOG Ab, s/p steroid, IVIG   Plantar fasciitis    Prostate cancer (HCC)    Raynauds disease    3 EPISODES FIRST MTP ARTHRITIS, LIKELY GOUT   Vitamin D deficiency     Past Surgical History:  Procedure Laterality Date   BUNIONECTOMY  2005   HERNIA REPAIR     irreversible electroporation  09/2023   of prostate   TONSILLECTOMY      No current facility-administered medications for this encounter.   Current Outpatient Medications  Medication Sig Dispense Refill Last Dose/Taking   aspirin  EC 81 MG tablet Take 81 mg by mouth daily.   Taking   atorvastatin  (LIPITOR) 20 MG tablet Take 20 mg by mouth daily.   Taking   Calcium  Citrate (CITRACAL PO) Take 1 tablet by mouth daily.   Taking   Multiple Vitamin (MULTIVITAMIN) capsule Take 1 capsule by mouth daily. Centrum   Taking   omeprazole (PRILOSEC) 20 MG capsule Take 20 mg by mouth 2 (two) times daily.   Taking   Allergies  Allergen Reactions   Other Other (See Comments)    Patient is a vegetarian    Social History   Tobacco Use   Smoking status: Never   Smokeless tobacco: Never  Substance Use Topics   Alcohol use: Yes    Alcohol/week: 7.0 standard drinks of alcohol    Types: 7 Glasses of wine per week    Family History  Problem Relation Age of Onset   Heart failure Mother        AFib   Atrial  fibrillation Father      Review of Systems  Objective:  Physical Exam Vitals reviewed.  Constitutional:      Appearance: Normal appearance. He is normal weight.  HENT:     Head: Normocephalic and atraumatic.     Mouth/Throat:     Mouth: Mucous membranes are dry.  Eyes:     Extraocular Movements: Extraocular movements intact.     Pupils: Pupils are equal, round, and reactive to light.  Cardiovascular:     Rate and Rhythm: Normal rate and regular rhythm.     Pulses: Normal pulses.  Pulmonary:     Effort: Pulmonary effort is normal.     Breath sounds: Normal breath sounds.   Abdominal:     Palpations: Abdomen is soft.  Musculoskeletal:     Cervical back: Normal range of motion and neck supple.     Right hip: Tenderness and bony tenderness present.  Neurological:     Mental Status: He is alert and oriented to person, place, and time.  Psychiatric:        Behavior: Behavior normal.     Vital signs in last 24 hours:    Labs:   Estimated body mass index is 23.2 kg/m as calculated from the following:   Height as of 04/08/24: 6' 2 (1.88 m).   Weight as of 04/08/24: 82 kg.   Imaging Review Plain radiographs and MRI demonstrate severe avascular necrosis     Assessment/Plan:  Avascular necrosis right hip(s)  The patient history, physical examination, clinical judgement of the provider and imaging studies are consistent with end stage AVN of the right hip(s) and total hip arthroplasty is deemed medically necessary. The treatment options including medical management, injection therapy, arthroscopy and arthroplasty were discussed at length. The risks and benefits of total hip arthroplasty were presented and reviewed. The risks due to aseptic loosening, infection, stiffness, dislocation/subluxation,  thromboembolic complications and other imponderables were discussed.  The patient acknowledged the explanation, agreed to proceed with the plan and consent was signed. Patient is being admitted for inpatient treatment for surgery, pain control, PT, OT, prophylactic antibiotics, VTE prophylaxis, progressive ambulation and ADL's and discharge planning.The patient is planning to be discharged home with home health services

## 2024-04-19 ENCOUNTER — Other Ambulatory Visit: Payer: Self-pay

## 2024-04-19 ENCOUNTER — Ambulatory Visit (HOSPITAL_COMMUNITY): Payer: PRIVATE HEALTH INSURANCE | Admitting: Vascular Surgery

## 2024-04-19 ENCOUNTER — Observation Stay (HOSPITAL_COMMUNITY): Payer: PRIVATE HEALTH INSURANCE

## 2024-04-19 ENCOUNTER — Encounter (HOSPITAL_COMMUNITY)
Admission: RE | Disposition: A | Discharge status: Home / Self Care | Payer: Self-pay | Source: Home / Self Care | Attending: Orthopaedic Surgery

## 2024-04-19 ENCOUNTER — Encounter (HOSPITAL_COMMUNITY): Payer: Self-pay | Admitting: Orthopaedic Surgery

## 2024-04-19 ENCOUNTER — Observation Stay (HOSPITAL_COMMUNITY)
Admission: RE | Admit: 2024-04-19 | Discharge: 2024-04-20 | Disposition: A | Discharge status: Home / Self Care | Payer: PRIVATE HEALTH INSURANCE | Attending: Orthopaedic Surgery | Admitting: Orthopaedic Surgery

## 2024-04-19 ENCOUNTER — Ambulatory Visit (HOSPITAL_COMMUNITY): Payer: PRIVATE HEALTH INSURANCE

## 2024-04-19 ENCOUNTER — Encounter (HOSPITAL_COMMUNITY): Payer: Self-pay | Admitting: Anesthesiology

## 2024-04-19 DIAGNOSIS — Z7982 Long term (current) use of aspirin: Secondary | ICD-10-CM | POA: Insufficient documentation

## 2024-04-19 DIAGNOSIS — M87051 Idiopathic aseptic necrosis of right femur: Principal | ICD-10-CM | POA: Insufficient documentation

## 2024-04-19 DIAGNOSIS — M87851 Other osteonecrosis, right femur: Secondary | ICD-10-CM | POA: Diagnosis present

## 2024-04-19 DIAGNOSIS — Z8546 Personal history of malignant neoplasm of prostate: Secondary | ICD-10-CM | POA: Diagnosis not present

## 2024-04-19 DIAGNOSIS — I2510 Atherosclerotic heart disease of native coronary artery without angina pectoris: Secondary | ICD-10-CM | POA: Insufficient documentation

## 2024-04-19 DIAGNOSIS — Z79899 Other long term (current) drug therapy: Secondary | ICD-10-CM | POA: Insufficient documentation

## 2024-04-19 DIAGNOSIS — Z96641 Presence of right artificial hip joint: Secondary | ICD-10-CM

## 2024-04-19 HISTORY — PX: TOTAL HIP ARTHROPLASTY: SHX124

## 2024-04-19 LAB — ABO/RH: ABO/RH(D): O POS

## 2024-04-19 SURGERY — ARTHROPLASTY, HIP, TOTAL, ANTERIOR APPROACH
Anesthesia: Spinal | Site: Hip | Laterality: Right

## 2024-04-19 MED ORDER — ONDANSETRON HCL 4 MG/2ML IJ SOLN
INTRAMUSCULAR | Status: DC | PRN
  Administered 2024-04-19: 4 mg via INTRAVENOUS

## 2024-04-19 MED ORDER — BUPIVACAINE IN DEXTROSE 0.75-8.25 % IT SOLN
INTRATHECAL | Status: DC | PRN
  Administered 2024-04-19: 1.8 mL via INTRATHECAL

## 2024-04-19 MED ORDER — ONDANSETRON HCL 4 MG/2ML IJ SOLN
INTRAMUSCULAR | Status: AC
  Filled 2024-04-19: qty 2

## 2024-04-19 MED ORDER — ONDANSETRON HCL 4 MG/2ML IJ SOLN: 4.0000 mg | Freq: Four times a day (QID) | INTRAMUSCULAR | Status: DC | PRN

## 2024-04-19 MED ORDER — FENTANYL CITRATE (PF) 250 MCG/5ML IJ SOLN
INTRAMUSCULAR | Status: DC | PRN
  Administered 2024-04-19 (×2): 25 ug via INTRAVENOUS

## 2024-04-19 MED ORDER — ORAL CARE MOUTH RINSE: 15.0000 mL | Freq: Once | OROMUCOSAL | Status: AC

## 2024-04-19 MED ORDER — POVIDONE-IODINE 10 % EX SWAB: 2.0000 | Freq: Once | CUTANEOUS | Status: DC

## 2024-04-19 MED ORDER — TAMSULOSIN HCL 0.4 MG PO CAPS
0.4000 mg | ORAL_CAPSULE | Freq: Every day | ORAL | Status: DC
  Administered 2024-04-19: 0.4 mg via ORAL
  Filled 2024-04-19: qty 1

## 2024-04-19 MED ORDER — PHENYLEPHRINE HCL-NACL 20-0.9 MG/250ML-% IV SOLN
INTRAVENOUS | Status: DC | PRN
  Administered 2024-04-19: 30 ug/min via INTRAVENOUS

## 2024-04-19 MED ORDER — PROPOFOL 10 MG/ML IV BOLUS
INTRAVENOUS | Status: AC
  Filled 2024-04-19: qty 20

## 2024-04-19 MED ORDER — PHENOL 1.4 % MT LIQD: 1.0000 | OROMUCOSAL | Status: DC | PRN

## 2024-04-19 MED ORDER — DOCUSATE SODIUM 100 MG PO CAPS
100.0000 mg | ORAL_CAPSULE | Freq: Two times a day (BID) | ORAL | Status: DC
Start: 2024-04-19 — End: 2024-04-20
  Administered 2024-04-19: 100 mg via ORAL
  Filled 2024-04-19: qty 1

## 2024-04-19 MED ORDER — MENTHOL 3 MG MT LOZG: 1.0000 | LOZENGE | OROMUCOSAL | Status: DC | PRN

## 2024-04-19 MED ORDER — FENTANYL CITRATE (PF) 100 MCG/2ML IJ SOLN: 25.0000 ug | INTRAMUSCULAR | Status: DC | PRN

## 2024-04-19 MED ORDER — PROPOFOL 1000 MG/100ML IV EMUL
INTRAVENOUS | Status: AC
  Filled 2024-04-19: qty 100

## 2024-04-19 MED ORDER — OXYCODONE HCL 5 MG PO TABS: 5.0000 mg | ORAL_TABLET | Freq: Once | ORAL | Status: DC | PRN

## 2024-04-19 MED ORDER — ONDANSETRON HCL 4 MG/2ML IJ SOLN
4.0000 mg | Freq: Once | INTRAMUSCULAR | Status: DC | PRN
Start: 2024-04-19 — End: 2024-04-19

## 2024-04-19 MED ORDER — HYDROMORPHONE HCL 1 MG/ML IJ SOLN: 0.5000 mg | INTRAMUSCULAR | Status: DC | PRN

## 2024-04-19 MED ORDER — POVIDONE-IODINE 10 % EX SWAB
2.0000 | Freq: Once | CUTANEOUS | Status: AC
  Administered 2024-04-19: 2 via TOPICAL

## 2024-04-19 MED ORDER — CEFAZOLIN SODIUM-DEXTROSE 2-4 GM/100ML-% IV SOLN
2.0000 g | INTRAVENOUS | Status: AC
  Administered 2024-04-19: 2 g via INTRAVENOUS
  Filled 2024-04-19: qty 100

## 2024-04-19 MED ORDER — MIDAZOLAM HCL 2 MG/2ML IJ SOLN
INTRAMUSCULAR | Status: AC
  Filled 2024-04-19: qty 2

## 2024-04-19 MED ORDER — OXYCODONE HCL 5 MG PO TABS
5.0000 mg | ORAL_TABLET | ORAL | Status: DC | PRN
  Administered 2024-04-19 – 2024-04-20 (×4): 5 mg via ORAL
  Filled 2024-04-19 (×4): qty 1

## 2024-04-19 MED ORDER — ONDANSETRON HCL 4 MG PO TABS: 4.0000 mg | ORAL_TABLET | Freq: Four times a day (QID) | ORAL | Status: DC | PRN

## 2024-04-19 MED ORDER — OXYCODONE HCL 5 MG/5ML PO SOLN: 5.0000 mg | Freq: Once | ORAL | Status: DC | PRN

## 2024-04-19 MED ORDER — METHOCARBAMOL 500 MG PO TABS: 500.0000 mg | ORAL_TABLET | Freq: Four times a day (QID) | ORAL | Status: DC | PRN

## 2024-04-19 MED ORDER — FENTANYL CITRATE (PF) 100 MCG/2ML IJ SOLN
INTRAMUSCULAR | Status: AC
  Filled 2024-04-19: qty 2

## 2024-04-19 MED ORDER — ACETAMINOPHEN 500 MG PO TABS
1000.0000 mg | ORAL_TABLET | Freq: Once | ORAL | Status: AC
  Administered 2024-04-19: 1000 mg via ORAL
  Filled 2024-04-19: qty 2

## 2024-04-19 MED ORDER — DIPHENHYDRAMINE HCL 12.5 MG/5ML PO ELIX: 12.5000 mg | ORAL_SOLUTION | ORAL | Status: DC | PRN

## 2024-04-19 MED ORDER — PANTOPRAZOLE SODIUM 40 MG PO TBEC
40.0000 mg | DELAYED_RELEASE_TABLET | Freq: Every day | ORAL | Status: DC
Start: 2024-04-19 — End: 2024-04-20
  Administered 2024-04-19 – 2024-04-20 (×2): 40 mg via ORAL
  Filled 2024-04-19 (×2): qty 1

## 2024-04-19 MED ORDER — PROPOFOL 500 MG/50ML IV EMUL
INTRAVENOUS | Status: DC | PRN
  Administered 2024-04-19: 100 ug/kg/min via INTRAVENOUS

## 2024-04-19 MED ORDER — TRANEXAMIC ACID-NACL 1000-0.7 MG/100ML-% IV SOLN
1000.0000 mg | INTRAVENOUS | Status: AC
  Administered 2024-04-19: 1000 mg via INTRAVENOUS
  Filled 2024-04-19: qty 100

## 2024-04-19 MED ORDER — METOCLOPRAMIDE HCL 5 MG/ML IJ SOLN: 5.0000 mg | Freq: Three times a day (TID) | INTRAMUSCULAR | Status: DC | PRN

## 2024-04-19 MED ORDER — CEFAZOLIN SODIUM-DEXTROSE 2-4 GM/100ML-% IV SOLN
2.0000 g | Freq: Four times a day (QID) | INTRAVENOUS | Status: AC
  Administered 2024-04-19 (×2): 2 g via INTRAVENOUS
  Filled 2024-04-19 (×2): qty 100

## 2024-04-19 MED ORDER — ALUM & MAG HYDROXIDE-SIMETH 200-200-20 MG/5ML PO SUSP: 30.0000 mL | ORAL | Status: DC | PRN

## 2024-04-19 MED ORDER — MIDAZOLAM HCL 2 MG/2ML IJ SOLN
INTRAMUSCULAR | Status: DC | PRN
  Administered 2024-04-19 (×2): 1 mg via INTRAVENOUS

## 2024-04-19 MED ORDER — ACETAMINOPHEN 325 MG PO TABS
325.0000 mg | ORAL_TABLET | Freq: Four times a day (QID) | ORAL | Status: DC | PRN
  Administered 2024-04-19: 650 mg via ORAL
  Filled 2024-04-19: qty 2

## 2024-04-19 MED ORDER — METOCLOPRAMIDE HCL 5 MG PO TABS: 5.0000 mg | ORAL_TABLET | Freq: Three times a day (TID) | ORAL | Status: DC | PRN

## 2024-04-19 MED ORDER — METHOCARBAMOL 1000 MG/10ML IJ SOLN: 500.0000 mg | Freq: Four times a day (QID) | INTRAMUSCULAR | Status: DC | PRN

## 2024-04-19 MED ORDER — LACTATED RINGERS IV SOLN: INTRAVENOUS | Status: DC

## 2024-04-19 MED ORDER — EPHEDRINE SULFATE-NACL 50-0.9 MG/10ML-% IV SOSY: PREFILLED_SYRINGE | INTRAVENOUS | Status: DC | PRN

## 2024-04-19 MED ORDER — CHLORHEXIDINE GLUCONATE 0.12 % MT SOLN
15.0000 mL | Freq: Once | OROMUCOSAL | Status: AC
  Administered 2024-04-19: 15 mL via OROMUCOSAL
  Filled 2024-04-19: qty 15

## 2024-04-19 MED ORDER — OXYCODONE HCL 5 MG PO TABS: 10.0000 mg | ORAL_TABLET | ORAL | Status: DC | PRN

## 2024-04-19 MED ORDER — SODIUM CHLORIDE 0.9 % IV SOLN: INTRAVENOUS | Status: DC

## 2024-04-19 MED ORDER — ASPIRIN 81 MG PO CHEW
81.0000 mg | CHEWABLE_TABLET | Freq: Two times a day (BID) | ORAL | Status: DC
Start: 2024-04-19 — End: 2024-04-20
  Administered 2024-04-19: 81 mg via ORAL
  Filled 2024-04-19: qty 1

## 2024-04-19 SURGICAL SUPPLY — 44 items
BAG COUNTER SPONGE SURGICOUNT (BAG) ×2 IMPLANT
BENZOIN TINCTURE PRP APPL 2/3 (GAUZE/BANDAGES/DRESSINGS) ×2 IMPLANT
BLADE CLIPPER SURG (BLADE) IMPLANT
BLADE SAW SGTL 18X1.27X75 (BLADE) ×2 IMPLANT
COVER SURGICAL LIGHT HANDLE (MISCELLANEOUS) ×2 IMPLANT
CUP ACET PNNCL SECTR W/GRIP 56 (Hips) IMPLANT
DRAPE C-ARM 42X72 X-RAY (DRAPES) ×2 IMPLANT
DRAPE STERI IOBAN 125X83 (DRAPES) ×2 IMPLANT
DRAPE U-SHAPE 47X51 STRL (DRAPES) ×6 IMPLANT
DRESSING AQUACEL AG SP 3.5X10 (GAUZE/BANDAGES/DRESSINGS) IMPLANT
DRSG AQUACEL AG ADV 3.5X10 (GAUZE/BANDAGES/DRESSINGS) ×2 IMPLANT
DURAPREP 26ML APPLICATOR (WOUND CARE) ×2 IMPLANT
ELECT BLADE 6.5 EXT (BLADE) IMPLANT
ELECTRODE BLDE 4.0 EZ CLN MEGD (MISCELLANEOUS) ×2 IMPLANT
ELECTRODE REM PT RTRN 9FT ADLT (ELECTROSURGICAL) ×2 IMPLANT
FACESHIELD WRAPAROUND (MASK) ×2 IMPLANT
FACESHIELD WRAPAROUND OR TEAM (MASK) ×4 IMPLANT
GLOVE BIOGEL PI IND STRL 8 (GLOVE) ×4 IMPLANT
GLOVE ECLIPSE 8.0 STRL XLNG CF (GLOVE) ×2 IMPLANT
GLOVE ORTHO TXT STRL SZ7.5 (GLOVE) ×4 IMPLANT
GOWN STRL REUS W/ TWL LRG LVL3 (GOWN DISPOSABLE) ×4 IMPLANT
GOWN STRL REUS W/ TWL XL LVL3 (GOWN DISPOSABLE) ×4 IMPLANT
HEAD CERAMIC 36 PLUS5 (Hips) IMPLANT
KIT BASIN OR (CUSTOM PROCEDURE TRAY) ×2 IMPLANT
KIT TURNOVER KIT B (KITS) ×2 IMPLANT
MANIFOLD NEPTUNE II (INSTRUMENTS) ×2 IMPLANT
NS IRRIG 1000ML POUR BTL (IV SOLUTION) ×2 IMPLANT
PACK TOTAL JOINT (CUSTOM PROCEDURE TRAY) ×2 IMPLANT
PAD ARMBOARD POSITIONER FOAM (MISCELLANEOUS) ×2 IMPLANT
PINNACLE ALTRX PLUS 4 N 36X56 (Hips) IMPLANT
SET HNDPC FAN SPRY TIP SCT (DISPOSABLE) ×2 IMPLANT
STAPLER SKIN PROX 35W (STAPLE) IMPLANT
STEM FEM ACTIS HIGH SZ7 (Stem) IMPLANT
STRIP CLOSURE SKIN 1/2X4 (GAUZE/BANDAGES/DRESSINGS) ×4 IMPLANT
SUT ETHIBOND NAB CT1 #1 30IN (SUTURE) ×2 IMPLANT
SUT MNCRL AB 4-0 PS2 18 (SUTURE) IMPLANT
SUT VIC AB 0 CT1 27XBRD ANBCTR (SUTURE) ×2 IMPLANT
SUT VIC AB 1 CT1 27XBRD ANBCTR (SUTURE) ×2 IMPLANT
SUT VIC AB 2-0 CT1 TAPERPNT 27 (SUTURE) ×2 IMPLANT
TOWEL GREEN STERILE (TOWEL DISPOSABLE) ×2 IMPLANT
TOWEL GREEN STERILE FF (TOWEL DISPOSABLE) ×2 IMPLANT
TRAY CATH INTERMITTENT SS 16FR (CATHETERS) IMPLANT
TRAY FOLEY W/BAG SLVR 16FR ST (SET/KITS/TRAYS/PACK) IMPLANT
WATER STERILE IRR 1000ML POUR (IV SOLUTION) ×4 IMPLANT

## 2024-04-19 NOTE — Anesthesia Procedure Notes (Signed)
 Spinal  Patient location during procedure: OR Start time: 04/19/2024 7:22 AM End time: 04/19/2024 7:24 AM Reason for block: surgical anesthesia Staffing Performed: anesthesiologist  Anesthesiologist: Lucious Debby BRAVO, MD Performed by: Lucious Debby BRAVO, MD Authorized by: Lucious Debby BRAVO, MD   Preanesthetic Checklist Completed: patient identified, IV checked, risks and benefits discussed, surgical consent, monitors and equipment checked, pre-op evaluation and timeout performed Spinal Block Patient position: sitting Prep: DuraPrep Patient monitoring: heart rate, cardiac monitor, continuous pulse ox and blood pressure Approach: midline Location: L3-4 Injection technique: single-shot Needle Needle type: Pencan  Needle gauge: 24 G Additional Notes Consent was obtained prior to the procedure with all questions answered and concerns addressed. Risks including, but not limited to, bleeding, infection, nerve damage, paralysis, failed block, inadequate analgesia, allergic reaction, high spinal, itching, and headache were discussed and the patient wished to proceed. Functioning IV was confirmed and monitors were applied. Sterile prep and drape, including hand hygiene, mask, and sterile gloves were used. The patient was positioned and the spine was prepped. The skin was anesthetized with lidocaine. Free flow of clear CSF was obtained prior to injecting local anesthetic into the CSF. The spinal needle aspirated freely following injection. The needle was carefully withdrawn. The patient tolerated the procedure well.   Debby Lucious, MD

## 2024-04-19 NOTE — Anesthesia Postprocedure Evaluation (Signed)
 Anesthesia Post Note  Patient: [name], Dr.  Harrie) Performed: ARTHROPLASTY, HIP, TOTAL, ANTERIOR APPROACH (Right: Hip)     Patient location during evaluation: PACU Anesthesia Type: Spinal Level of consciousness: awake and alert Pain management: pain level controlled Vital Signs Assessment: post-procedure vital signs reviewed and stable Respiratory status: spontaneous breathing and respiratory function stable Cardiovascular status: blood pressure returned to baseline and stable Postop Assessment: spinal receding and no apparent nausea or vomiting Anesthetic complications: no   No notable events documented.  Last Vitals:  Vitals:   04/19/24 0930 04/19/24 0945  BP: 103/64 107/60  Pulse: (!) 52 (!) 55  Resp: 16 10  Temp:    SpO2: 94% 96%    Last Pain:  Vitals:   04/19/24 0945  TempSrc:   PainSc: 0-No pain    LLE Motor Response: Purposeful movement (04/19/24 0945)   RLE Motor Response: Purposeful movement (04/19/24 0945)   L Sensory Level: L3-Anterior knee, lower leg (04/19/24 0945) R Sensory Level: L3-Anterior knee, lower leg (04/19/24 0945)  Debby FORBES Like

## 2024-04-19 NOTE — Transfer of Care (Signed)
 Immediate Anesthesia Transfer of Care Note  Patient: Joshua Jarvis, Dr.  Harrie) Performed: ARTHROPLASTY, HIP, TOTAL, ANTERIOR APPROACH (Right: Hip)  Patient Location: PACU  Anesthesia Type:Spinal  Level of Consciousness: awake, alert , oriented, drowsy, and patient cooperative  Airway & Oxygen Therapy: Patient Spontanous Breathing and Patient connected to face mask oxygen  Post-op Assessment: Report given to RN and Post -op Vital signs reviewed and stable  Post vital signs: Reviewed and stable  Last Vitals:  Vitals Value Taken Time  BP 96/60 0855  Temp    Pulse 54 0855  Resp 13 0855  SpO2 98% 0855    Last Pain:  Vitals:   04/19/24 0616  TempSrc:   PainSc: 0-No pain         Complications: No notable events documented.

## 2024-04-19 NOTE — Discharge Instructions (Signed)
 Per [facility] clinic policy, our goal is ensure optimal postoperative pain control with a multimodal pain management strategy. For all [facility] patients, our goal is to wean post-operative narcotic medications by 6 weeks post-operatively. If this is not possible due to utilization of pain medication prior to surgery, your [facility] doctor will support your acute post-operative pain control for the first 6 weeks postoperatively, with a plan to transition you back to your primary pain team following that. Joshua Jarvis will work to ensure a Therapist, occupational.  INSTRUCTIONS AFTER JOINT REPLACEMENT   Remove items at home which could result in a fall. This includes throw rugs or furniture in walking pathways ICE to the affected joint every three hours while awake for 30 minutes at a time, for at least the first 3-5 days, and then as needed for pain and swelling.  Continue to use ice for pain and swelling. You may notice swelling that will progress down to the foot and ankle.  This is normal after surgery.  Elevate your leg when you are not up walking on it.   Continue to use the breathing machine you got in the hospital (incentive spirometer) which will help keep your temperature down.  It is common for your temperature to cycle up and down following surgery, especially at night when you are not up moving around and exerting yourself.  The breathing machine keeps your lungs expanded and your temperature down.   DIET:  As you were doing prior to hospitalization, we recommend a well-balanced diet.  DRESSING / WOUND CARE / SHOWERING  Keep the surgical dressing until follow up.  The dressing is water proof, so you can shower without any extra covering.  IF THE DRESSING FALLS OFF or the wound gets wet inside, change the dressing with sterile gauze.  Please use good hand washing techniques before changing the dressing.  Do not use any lotions or creams on the incision until instructed by your surgeon.     ACTIVITY  Increase activity slowly as tolerated, but follow the weight bearing instructions below.   No driving for 6 weeks or until further direction given by your physician.  You cannot drive while taking narcotics.  No lifting or carrying greater than 10 lbs. until further directed by your surgeon. Avoid periods of inactivity such as sitting longer than an hour when not asleep. This helps prevent blood clots.  You may return to work once you are authorized by your doctor.     WEIGHT BEARING   Weight bearing as tolerated with assist device (walker, cane, etc) as directed, use it as long as suggested by your surgeon or therapist, typically at least 4-6 weeks.   EXERCISES  Results after joint replacement surgery are often greatly improved when you follow the exercise, range of motion and muscle strengthening exercises prescribed by your doctor. Safety measures are also important to protect the joint from further injury. Any time any of these exercises cause you to have increased pain or swelling, decrease what you are doing until you are comfortable again and then slowly increase them. If you have problems or questions, call your caregiver or physical therapist for advice.   Rehabilitation is important following a joint replacement. After just a few days of immobilization, the muscles of the leg can become weakened and shrink (atrophy).  These exercises are designed to build up the tone and strength of the thigh and leg muscles and to improve motion. Often times heat used for twenty to thirty minutes before  working out will loosen up your tissues and help with improving the range of motion but do not use heat for the first two weeks following surgery (sometimes heat can increase post-operative swelling).   These exercises can be done on a training (exercise) mat, on the floor, on a table or on a bed. Use whatever works the best and is most comfortable for you.    Use music or television  while you are exercising so that the exercises are a pleasant break in your day. This will make your life better with the exercises acting as a break in your routine that you can look forward to.   Perform all exercises about fifteen times, three times per day or as directed.  You should exercise both the operative leg and the other leg as well.  Exercises include:   Quad Sets - Tighten up the muscle on the front of the thigh (Quad) and hold for 5-10 seconds.   Straight Leg Raises - With your knee straight (if you were given a brace, keep it on), lift the leg to 60 degrees, hold for 3 seconds, and slowly lower the leg.  Perform this exercise against resistance later as your leg gets stronger.  Leg Slides: Lying on your back, slowly slide your foot toward your buttocks, bending your knee up off the floor (only go as far as is comfortable). Then slowly slide your foot back down until your leg is flat on the floor again.  Angel Wings: Lying on your back spread your legs to the side as far apart as you can without causing discomfort.  Hamstring Strength:  Lying on your back, push your heel against the floor with your leg straight by tightening up the muscles of your buttocks.  Repeat, but this time bend your knee to a comfortable angle, and push your heel against the floor.  You may put a pillow under the heel to make it more comfortable if necessary.   A rehabilitation program following joint replacement surgery can speed recovery and prevent re-injury in the future due to weakened muscles. Contact your doctor or a physical therapist for more information on knee rehabilitation.    CONSTIPATION  Constipation is defined medically as fewer than three stools per week and severe constipation as less than one stool per week.  Even if you have a regular bowel pattern at home, your normal regimen is likely to be disrupted due to multiple reasons following surgery.  Combination of anesthesia, postoperative  narcotics, change in appetite and fluid intake all can affect your bowels.   YOU MUST use at least one of the following options; they are listed in order of increasing strength to get the job done.  They are all available over the counter, and you may need to use some, POSSIBLY even all of these options:    Drink plenty of fluids (prune juice may be helpful) and high fiber foods Colace 100 mg by mouth twice a day  Senokot for constipation as directed and as needed Dulcolax (bisacodyl), take with full glass of water  Miralax (polyethylene glycol) once or twice a day as needed.  If you have tried all these things and are unable to have a bowel movement in the first 3-4 days after surgery call either your surgeon or your primary doctor.    If you experience loose stools or diarrhea, hold the medications until you stool forms back up.  If your symptoms do not get better within 1 week  or if they get worse, check with your doctor.  If you experience "the worst abdominal pain ever" or develop nausea or vomiting, please contact the office immediately for further recommendations for treatment.   ITCHING:  If you experience itching with your medications, try taking only a single pain pill, or even half a pain pill at a time.  You can also use Benadryl over the counter for itching or also to help with sleep.   TED HOSE STOCKINGS:  Use stockings on both legs until for at least 2 weeks or as directed by physician office. They may be removed at night for sleeping.  MEDICATIONS:  See your medication summary on the "After Visit Summary" that nursing will review with you.  You may have some home medications which will be placed on hold until you complete the course of blood thinner medication.  It is important for you to complete the blood thinner medication as prescribed.  PRECAUTIONS:  If you experience chest pain or shortness of breath - call 911 immediately for transfer to the hospital emergency department.    If you develop a fever greater that 101 F, purulent drainage from wound, increased redness or drainage from wound, foul odor from the wound/dressing, or calf pain - CONTACT YOUR SURGEON.                                                   FOLLOW-UP APPOINTMENTS:  If you do not already have a post-op appointment, please call the office for an appointment to be seen by your surgeon.  Guidelines for how soon to be seen are listed in your "After Visit Summary", but are typically between 1-4 weeks after surgery.  OTHER INSTRUCTIONS:   Knee Replacement:  Do not place pillow under knee, focus on keeping the knee straight while resting. CPM instructions: 0-90 degrees, 2 hours in the morning, 2 hours in the afternoon, and 2 hours in the evening. Place foam block, curve side up under heel at all times except when in CPM or when walking.  DO NOT modify, tear, cut, or change the foam block in any way.  POST-OPERATIVE OPIOID TAPER INSTRUCTIONS: It is important to wean off of your opioid medication as soon as possible. If you do not need pain medication after your surgery it is ok to stop day one. Opioids include: Codeine, Hydrocodone(Norco, Vicodin), Oxycodone(Percocet, oxycontin) and hydromorphone amongst others.  Long term and even short term use of opiods can cause: Increased pain response Dependence Constipation Depression Respiratory depression And more.  Withdrawal symptoms can include Flu like symptoms Nausea, vomiting And more Techniques to manage these symptoms Hydrate well Eat regular healthy meals Stay active Use relaxation techniques(deep breathing, meditating, yoga) Do Not substitute Alcohol to help with tapering If you have been on opioids for less than two weeks and do not have pain than it is ok to stop all together.  Plan to wean off of opioids This plan should start within one week post op of your joint replacement. Maintain the same interval or time between taking each dose  and first decrease the dose.  Cut the total daily intake of opioids by one tablet each day Next start to increase the time between doses. The last dose that should be eliminated is the evening dose.   MAKE SURE YOU:  Understand these instructions.  Get help right away if you are not doing well or get worse.    Thank you for letting us be a part of your medical care team.  It is a privilege we respect greatly.  We hope these instructions will help you stay on track for a fast and full recovery!      Dental Antibiotics:  In most cases prophylactic antibiotics for Dental procdeures after total joint surgery are not necessary.  Exceptions are as follows:  1. History of prior total joint infection  2. Severely immunocompromised (Organ Transplant, cancer chemotherapy, Rheumatoid biologic meds such as Humera)  3. Poorly controlled diabetes (A1C &gt; 8.0, blood glucose over 200)  If you have one of these conditions, contact your surgeon for an antibiotic prescription, prior to your dental procedure.

## 2024-04-19 NOTE — Interval H&P Note (Signed)
 History and Physical Interval Note: The patient is aware that he is here today for a right total hip arthroplasty to treat his significant right hip avascular necrosis.  There is been no acute or interval changes in medical status.  The risks and benefits of surgery have been discussed in detail and informed consent has been obtained.  The right operative hip has been marked.  04/19/2024 7:00 AM  [name], Dr.  has presented today for surgery, with the diagnosis of RIGHT HIP AVASCULAR NECROSIS.  The various methods of treatment have been discussed with the patient and family. After consideration of risks, benefits and other options for treatment, the patient has consented to  Procedure(s): ARTHROPLASTY, HIP, TOTAL, ANTERIOR APPROACH (Right) as a surgical intervention.  The patient's history has been reviewed, patient examined, no change in status, stable for surgery.  I have reviewed the patient's chart and labs.  Questions were answered to the patient's satisfaction.     Joshua Jarvis

## 2024-04-19 NOTE — Evaluation (Signed)
 Physical Therapy Evaluation Patient Details Name: Joshua Jarvis, Dr. MRN: [id number] DOB: 28-Nov-1950 Today's Date: 04/19/2024  History of Present Illness  Pt is a 73 y.o. M presenting to [facility] on 04/19/24 for elective R THA. PMH is significant CAD, hypercholesterolemia, and monoarthritis of R foot.  Clinical Impression  Prior to admittance pt was mod I using SPC and RW for mobility and was independent w/ ADLs. Pt presents to evaluation with deficits in mobility, strength, power, balance, activity tolerance, and pain, all limiting pt's ability to mobilize near baseline. Pt was able to ambulate w/ AD and no physical assistance given. Pt was given exercise handout and educated on its contents. Further education provided on importance of modalities and mobilizing frequently. Pt would benefit from further gait and stair training. PT will continue to treat pt while he is admitted.         If plan is discharge home, recommend the following: A little help with walking and/or transfers;A little help with bathing/dressing/bathroom;Assist for transportation;Help with stairs or ramp for entrance   Can travel by private vehicle        Equipment Recommendations None recommended by PT  Recommendations for Other Services       Functional Status Assessment Patient has had a recent decline in their functional status and demonstrates the ability to make significant improvements in function in a reasonable and predictable amount of time.     Precautions / Restrictions Precautions Precautions: Fall Recall of Precautions/Restrictions: Intact Restrictions Weight Bearing Restrictions Per Provider Order: Yes RLE Weight Bearing Per Provider Order: Weight bearing as tolerated      Mobility  Bed Mobility Overal bed mobility: Needs Assistance Bed Mobility: Supine to Sit, Sit to Supine     Supine to sit: Supervision, HOB elevated Sit to supine: Supervision, HOB elevated   General bed mobility comments:  Increased time to complete. Pt uses BUE to assist RLE into bed.    Transfers Overall transfer level: Needs assistance Equipment used: Rolling walker (2 wheels) Transfers: Sit to/from Stand Sit to Stand: Supervision           General transfer comment: STS from EOB w/ RW. VC given for sequencing, UE and LE placement; increased time to complete.    Ambulation/Gait Ambulation/Gait assistance: Supervision Gait Distance (Feet): 100 Feet Assistive device: Rolling walker (2 wheels) Gait Pattern/deviations: Step-through pattern, Decreased stride length, Decreased step length - right, Decreased stance time - right Gait velocity: reduced Gait velocity interpretation: <1.8 ft/sec, indicate of risk for recurrent falls   General Gait Details: Pt ambulates w/ reciprocal gait pattern and increased reliance on RW for stability.  Stairs            Wheelchair Mobility     Tilt Bed    Modified Rankin (Stroke Patients Only)       Balance Overall balance assessment: Needs assistance Sitting-balance support: No upper extremity supported, Feet supported Sitting balance-Leahy Scale: Good Sitting balance - Comments: seated EOB   Standing balance support: Bilateral upper extremity supported, During functional activity, Reliant on assistive device for balance Standing balance-Leahy Scale: Poor Standing balance comment: reliant on external support                             Pertinent Vitals/Pain Pain Assessment Pain Assessment: Faces Faces Pain Scale: Hurts little more Pain Location: R hip Pain Descriptors / Indicators: Constant, Discomfort, Grimacing, Sore Pain Intervention(s): Limited activity within patient's tolerance, Monitored during  session    Home Living Family/patient expects to be discharged to:: Private residence Living Arrangements: Spouse/significant other Available Help at Discharge: Family;Available 24 hours/day Type of Home: House Home Access: Stairs  to enter Entrance Stairs-Rails: None Entrance Stairs-Number of Steps: 1   Home Layout: One level Home Equipment: Agricultural consultant (2 wheels);Cane - single point;Crutches;Toilet riser      Prior Function Prior Level of Function : Independent/Modified Independent;Working/employed;Driving             Mobility Comments: mod I w/ SPC and RW; depends on the day. ADLs Comments: independent     Extremity/Trunk Assessment   Upper Extremity Assessment Upper Extremity Assessment: Overall WFL for tasks assessed    Lower Extremity Assessment Lower Extremity Assessment: RLE deficits/detail RLE Deficits / Details: reduced strength as per functional assessment RLE: Unable to fully assess due to pain    Cervical / Trunk Assessment Cervical / Trunk Assessment: Kyphotic  Communication   Communication Communication: No apparent difficulties    Cognition Arousal: Alert Behavior During Therapy: WFL for tasks assessed/performed   PT - Cognitive impairments: No apparent impairments                         Following commands: Intact       Cueing Cueing Techniques: Verbal cues, Gestural cues     General Comments General comments (skin integrity, edema, etc.): no signs of acute distress    Exercises     Assessment/Plan    PT Assessment Patient needs continued PT services  PT Problem List Decreased strength;Decreased range of motion;Decreased activity tolerance;Decreased balance;Decreased mobility;Decreased coordination;Pain       PT Treatment Interventions DME instruction;Gait training;Stair training;Functional mobility training;Therapeutic activities;Therapeutic exercise;Balance training;Neuromuscular re-education;Wheelchair mobility training;Patient/family education;Manual techniques;Modalities    PT Goals (Current goals can be found in the Care Plan section)  Acute Rehab PT Goals Patient Stated Goal: to go home PT Goal Formulation: With patient Time For Goal  Achievement: 05/03/24 Potential to Achieve Goals: Good    Frequency 7X/week     Co-evaluation               AM-PAC PT 6 Clicks Mobility  Outcome Measure Help needed turning from your back to your side while in a flat bed without using bedrails?: A Little Help needed moving from lying on your back to sitting on the side of a flat bed without using bedrails?: A Little Help needed moving to and from a bed to a chair (including a wheelchair)?: A Little Help needed standing up from a chair using your arms (e.g., wheelchair or bedside chair)?: A Little Help needed to walk in hospital room?: A Little Help needed climbing 3-5 steps with a railing? : A Little 6 Click Score: 18    End of Session Equipment Utilized During Treatment: Gait belt Activity Tolerance: Patient tolerated treatment well Patient left: in bed;with call bell/phone within reach Nurse Communication: Mobility status PT Visit Diagnosis: Other abnormalities of gait and mobility (R26.89);Muscle weakness (generalized) (M62.81);Pain Pain - Right/Left: Right Pain - part of body: Hip    Time: 8597-8579 PT Time Calculation (min) (ACUTE ONLY): 18 min   Charges:   PT Evaluation $PT Eval Low Complexity: 1 Low   PT General Charges $$ ACUTE PT VISIT: 1 Visit         Leontine Hilt, SPT Acute Rehab [phone]   Leontine Hilt 04/19/2024, 3:28 PM

## 2024-04-19 NOTE — Op Note (Signed)
 Operative Note  Date of operation: 04/19/2024 Preoperative diagnosis: Right hip avascular necrosis Postoperative diagnosis: Same  Procedure: Right direct anterior total hip arthroplasty  Implants: Implant Name Type Inv. Item Serial No. Manufacturer Lot No. LRB No. Used Action  CUP ACET PNNCL SECTR W/GRIP 56 - [id number] Hips CUP ACET PNNCL SECTR W/GRIP 56  [employer] [id number] Right 1 Implanted  PINNACLE ALTRX PLUS 4 N 36X56 - [id number] Hips PINNACLE ALTRX PLUS 4 N 36X56  [employer] [id number] Right 1 Implanted  STEM FEM ACTIS HIGH SZ7 - [id number] Stem STEM FEM ACTIS HIGH SZ7  [employer] [id number] Right 1 Implanted  HEAD CERAMIC 36 PLUS5 - [id number] Hips HEAD CERAMIC 36 PLUS5  [employer] [id number] Right 1 Implanted   Surgeon: Lonni GRADE. 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  This is occurred over the last few months and his right hip pain has become significant and daily.  Plain films and especially a MRI of his right hip confirm the extent of the AVN involving his right hip.  There is a small area of AVN in the left hip femoral head but he is asymptomatic on that side.  At this point given the extent of his AVN, we have recommended a total hip arthroplasty.  We discussed the approach of an anterior hip replacement.  We did talk in length in detail about the risks of acute blood loss anemia, nerve and vessel injury, infection, DVT, dislocation, implant failure, leg length differences and wound healing issues.  He understands that our goals are hopefully decreased pain, improved mobility and improved quality of life.  Procedure description: After informed consent was obtained and the appropriate right hip was marked, the patient was brought  to the operating room and set up on the stretcher where spinal anesthesia was obtained.  He was laid in supine position on stretcher and a Foley catheter was placed.  Traction boots were next placed on both his feet and then he was placed supine on the Hana fracture table with a perineal post in place and both legs in inline skeletal traction devices but no traction applied.  His right operative hip and pelvis were assessed radiographically.  The right hip was prepped and draped with DuraPrep and sterile drapes.  A timeout was called and he was identified as the correct patient and the correct right hip.  An incision was then made just inferior and posterior to the ASIS and carried slightly obliquely down the leg.  Dissection was carried down to the tensor fascia lata muscle and the tensor fascia was divided longitudinally to proceed with a direct interposed the hip.  Circumflex vessels were identified and cauterized.  The hip capsule was identified and opened up in L-type format finding a large joint effusion.  Cobra retractors were placed around the medial and lateral femoral neck and a femoral neck cut was made with an oscillating saw just proximal to the lesser trochanter.  This cut was completed with an osteotome.  A corkscrew guide was placed in the femoral head and the femoral head was removed in its entirety and there was evidence of avascular necrosis.  A bent Hohmann was then placed over the medial acetabular rim and the acetabular labrum and other debris were removed.  Reaming was then initiated  from a size 43 reamer and stepwise increments going up to a size 55 reamer with all reamers placed under direct visualization and the last reamer also placed under direct fluoroscopy in order to obtain the depth of reaming, the inclination and the anteversion.  The real DePuy Sectra GRIPTION acetabular component size 56 was then placed without difficulty followed by a 36+4 polythene liner based on the patient's  offset.  Attention was then turned to the femur.  With the right leg externally rotated to 120 degrees, extended and adducted, a Mueller retractor was placed medially and a Hohmann retractor behind the greater trochanter.  The lateral joint capsule was released and a box cutting osteotome was used into the femoral canal.  Broaching was then initiated using the Actis broaching system from a size 0 going up to a size 7.  With a size 7 in place we trialed a high offset femoral neck again based on the patient's anatomy and a 36+1.5 trial hip ball.  The right leg was brought over and up and with traction and internal rotation reduced in the pelvis.  Based on radiographic and clinical assessment we felt like we needed just a little bit more leg length.  We dislocated the hip and removed the trial components.  We placed the real Actis femoral component with high offset size 7 and 1 with the real 36+5 ceramic head ball.  Again this was reduced in the pelvis and we are pleased with leg length, offset, range of motion and stability and we assessed these radiographically and clinically.  The soft tissue was then irrigated with normal saline solution.  The joint capsule was closed with interrupted #1 Ethibond suture followed by normal Vicryl close the tensor fascia.  0 Vicryl was used to close the deep tissue and 2-0 Vicryl was used to close subcutaneous tissue.  The skin was reapproximated with staples.  An Aquacel dressing was applied.  The patient was taken off of the Hana table and taken the recovery room.  Tory Gaskins, PA-C did assist during the entire case and beginning in his assistance was crucial and medically necessary for soft tissue management and retraction, helping guide implant placement and a layered closure of the wound.

## 2024-04-20 ENCOUNTER — Encounter (HOSPITAL_COMMUNITY): Payer: Self-pay | Admitting: Orthopaedic Surgery

## 2024-04-20 DIAGNOSIS — M87851 Other osteonecrosis, right femur: Secondary | ICD-10-CM | POA: Diagnosis not present

## 2024-04-20 LAB — CBC
HCT: 37.6 % — ABNORMAL LOW (ref 39.0–52.0)
Hemoglobin: 12.9 g/dL — ABNORMAL LOW (ref 13.0–17.0)
MCH: 30.6 pg (ref 26.0–34.0)
MCHC: 34.3 g/dL (ref 30.0–36.0)
MCV: 89.3 fL (ref 80.0–100.0)
Platelets: 156 K/uL (ref 150–400)
RBC: 4.21 MIL/uL — ABNORMAL LOW (ref 4.22–5.81)
RDW: 13 % (ref 11.5–15.5)
WBC: 6.9 K/uL (ref 4.0–10.5)
nRBC: 0 % (ref 0.0–0.2)

## 2024-04-20 LAB — BASIC METABOLIC PANEL WITH GFR
Anion gap: 11 (ref 5–15)
BUN: 10 mg/dL (ref 8–23)
CO2: 24 mmol/L (ref 22–32)
Calcium: 8.4 mg/dL — ABNORMAL LOW (ref 8.9–10.3)
Chloride: 99 mmol/L (ref 98–111)
Creatinine, Ser: 0.8 mg/dL (ref 0.61–1.24)
GFR, Estimated: >60 mL/min (ref >60)
Glucose, Bld: 122 mg/dL — ABNORMAL HIGH (ref 70–99)
Potassium: 3.6 mmol/L (ref 3.5–5.1)
Sodium: 134 mmol/L — ABNORMAL LOW (ref 135–145)

## 2024-04-20 MED ORDER — ATORVASTATIN CALCIUM 10 MG PO TABS
20.0000 mg | ORAL_TABLET | Freq: Every day | ORAL | Status: DC
  Administered 2024-04-20: 20 mg via ORAL
  Filled 2024-04-20: qty 2

## 2024-04-20 MED ORDER — OXYCODONE HCL 5 MG PO TABS: 5.0000 mg | ORAL_TABLET | Freq: Four times a day (QID) | ORAL | 0 refills | Status: AC | PRN

## 2024-04-20 MED ORDER — ASPIRIN 81 MG PO CHEW: 81.0000 mg | CHEWABLE_TABLET | Freq: Two times a day (BID) | ORAL | 0 refills | Status: AC

## 2024-04-20 MED ORDER — METHOCARBAMOL 500 MG PO TABS: 500.0000 mg | ORAL_TABLET | Freq: Four times a day (QID) | ORAL | 1 refills | Status: AC | PRN

## 2024-04-20 MED ORDER — ENOXAPARIN SODIUM 40 MG/0.4ML IJ SOSY
40.0000 mg | PREFILLED_SYRINGE | Freq: Once | INTRAMUSCULAR | Status: AC
  Administered 2024-04-20: 40 mg via SUBCUTANEOUS
  Filled 2024-04-20: qty 0.4

## 2024-04-20 NOTE — Discharge Summary (Signed)
 Patient ID: JAMICHEAL HEARD, Dr. MRN: [id number] DOB/AGE: 72-Aug-1952 72 y.o.  Admit date: 04/19/2024 Discharge date: 04/20/2024  Admission Diagnoses:  Principal Problem:   Avascular necrosis of right hip [facility]) Active Problems:   Status post total replacement of right hip   Discharge Diagnoses:  Same  Past Medical History:  Diagnosis Date   Bradycardia    Elevated coronary artery calcium  score 11/16/2018   Erectile dysfunction    Family history of adverse reaction to anesthesia    pt's father's HR dropped significantly under anesthesia once (was fine with other surgeries)- had underlying bradycardia   GERD (gastroesophageal reflux disease)    Hypercholesterolemia    Hyperlipidemia    Monoarthritis of foot, right    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 Patient has severe unremitting pain that affects sleep, daily activities, and work/hobbies. After pre-op clearance the patient was taken to the operating room on 04/19/2024 and underwent  Procedure(s): ARTHROPLASTY, HIP, TOTAL, ANTERIOR APPROACH.    Patient was given perioperative antibiotics:  Anti-infectives (From admission, onward)    Start     Dose/Rate Route Frequency Ordered Stop   04/19/24 1300  ceFAZolin  (ANCEF ) IVPB 2g/100 mL premix        2 g 200 mL/hr over 30 Minutes Intravenous Every 6 hours 04/19/24 1051 04/19/24 1841   04/19/24 0600  ceFAZolin  (ANCEF ) IVPB 2g/100 mL premix        2 g 200 mL/hr over 30 Minutes Intravenous On call to O.R. 04/19/24 0554 04/19/24 0730         Patient was given sequential compression devices, early ambulation, and chemoprophylaxis to prevent DVT.  Patient benefited maximally from hospital stay and there were no complications.    Recent vital signs: Patient Vitals for the past 24 hrs:  BP Temp Temp src Pulse Resp SpO2  04/20/24 0820 (!) 142/71 99.4 F (37.4 C) Oral 73 16 94 %  04/20/24 0528 (!) 153/72 99.3 F (37.4 C) Oral 74 17 98 %  04/19/24 2210 135/73 97.9 F (36.6 C) Oral 75 17 97 %  04/19/24 1915 130/75 100.2 F (37.9 C) Oral 80 18 98 %  04/19/24 1528 (!) 151/82 98.4 F (36.9 C) Oral 73 18 97 %     Recent laboratory studies:  Recent Labs    04/20/24 0856  WBC 6.9  HGB 12.9*  HCT 37.6*  PLT 156  NA 134*  K 3.6  CL 99  CO2 24  BUN 10  CREATININE 0.80  GLUCOSE 122*  CALCIUM  8.4*     Discharge Medications:   Allergies as of 04/20/2024       Reactions   Other Other (See Comments)   Patient is a vegetarian        Medication List     STOP taking these medications    aspirin  EC 81 MG tablet Replaced by: aspirin  81 MG chewable tablet       TAKE these medications    aspirin  81 MG chewable tablet Chew 1 tablet (81  mg total) by mouth 2 (two) times daily. Replaces: aspirin  EC 81 MG tablet   atorvastatin  20 MG tablet Commonly known as: LIPITOR Take 20 mg by mouth daily.   CITRACAL PO Take 1 tablet by mouth daily.   methocarbamol  500 MG tablet Commonly known as: ROBAXIN  Take 1 tablet (500 mg total) by mouth every 6 (six) hours as needed for muscle spasms.   multivitamin capsule Take 1 capsule by mouth daily. Centrum   omeprazole 20 MG capsule Commonly known as: PRILOSEC Take 20 mg by mouth 2 (two) times daily.   oxyCODONE  5 MG immediate release tablet Commonly known as: Oxy IR/ROXICODONE  Take 1-2 tablets (5-10 mg total) by mouth every 6 (six) hours as needed for moderate pain (pain score 4-6) (pain score 4-6).               Durable Medical Equipment  (From  admission, onward)           Start     Ordered   04/19/24 1052  DME 3 n 1  Once        04/19/24 1051   04/19/24 1052  DME Walker rolling  Once       Question Answer Comment  Walker: With 5 Inch Wheels   Patient needs a walker to treat with the following condition Status post total replacement of right hip      04/19/24 1051            Diagnostic Studies: DG Pelvis Portable Result Date: 04/19/2024 CLINICAL DATA:  Status post right total hip arthroplasty. EXAM: PORTABLE PELVIS 1-2 VIEWS COMPARISON:  03/09/2024. FINDINGS: Status post right total hip arthroplasty with normal alignment. No acute fracture or dislocation. Expected postoperative soft tissue swelling, soft tissue air, and cutaneous staples along the right hip. IMPRESSION: Status post right total hip arthroplasty.  No acute complication. Electronically Signed   By: Harrietta Sherry M.D.   On: 04/19/2024 10:05   DG HIP UNILAT WITH PELVIS 1V RIGHT Result Date: 04/19/2024 CLINICAL DATA:  461500 Elective surgery 461500 EXAM: DG HIP (WITH OR WITHOUT PELVIS) 1V RIGHT COMPARISON:  03/09/2024. FINDINGS: Multiple intraoperative fluoroscopic spot images are provided. Interval right total hip arthroplasty. Normal alignment. Total fluoroscopy time: 29 seconds Total dose: Radiation Exposure Index (as provided by the fluoroscopic device): 2.35 mGy air Kerma Please see intraoperative findings for further detail. IMPRESSION: Intraoperative fluoroscopy of interval right total hip arthroplasty. Electronically Signed   By: Harrietta Sherry M.D.   On: 04/19/2024 09:31   DG C-Arm 1-60 Min-No Report Result Date: 04/19/2024 Fluoroscopy was utilized by the requesting physician.  No radiographic interpretation.    Disposition: Discharge disposition: 01-Home or Self Care          Follow-up Information     [facility] Follow up.   Why: [facility] will contact you for the first home visit Contact information: [phone]         Vernetta Lonni GRADE, MD Follow up in 2 week(s).   Specialty: Orthopedic Surgery Contact information: [address] [place] [address] [phone]                  Signed: Lonni GRADE Vernetta 04/20/2024, 12:48 PM

## 2024-04-20 NOTE — Plan of Care (Signed)
 Pt doing well. Pt given D/C instructions with verbal understanding. Rx's were sent to the pharmacy by MD. Pt's incision is clean and dry with no sign of infection. Pt's IV was removed prior to D/C. Pt received RW from [employer] per MD order. Pt D/C'd home via wheelchair per MD order. Pt is stable @ D/C and has no other needs at this time. Rema Fendt, RN

## 2024-04-20 NOTE — Progress Notes (Signed)
 Subjective: 1 Day Post-Op Procedure(s) (LRB): ARTHROPLASTY, HIP, TOTAL, ANTERIOR APPROACH (Right) Patient reports pain as moderate.    Objective: Vital signs in last 24 hours: Temp:  [97.5 F (36.4 C)-100.2 F (37.9 C)] 99.3 F (37.4 C) (07/09 0528) Pulse Rate:  [45-80] 74 (07/09 0528) Resp:  [10-18] 17 (07/09 0528) BP: (91-153)/(58-82) 153/72 (07/09 0528) SpO2:  [93 %-100 %] 98 % (07/09 0528)  Intake/Output from previous day: 07/08 0701 - 07/09 0700 In: 963.3 [I.V.:800; IV Piggyback:163.3] Out: 1260 [Urine:1210; Blood:50] Intake/Output this shift: No intake/output data recorded.  No results for input(s): HGB in the last 72 hours. No results for input(s): WBC, RBC, HCT, PLT in the last 72 hours. No results for input(s): NA, K, CL, CO2, BUN, CREATININE, GLUCOSE, CALCIUM  in the last 72 hours. No results for input(s): LABPT, INR in the last 72 hours.  Sensation intact distally Intact pulses distally Dorsiflexion/Plantar flexion intact Incision: dressing C/D/I   Assessment/Plan: 1 Day Post-Op Procedure(s) (LRB): ARTHROPLASTY, HIP, TOTAL, ANTERIOR APPROACH (Right) Up with therapy Discharge home with home health      Joshua Jarvis 04/20/2024, 7:07 AM

## 2024-04-20 NOTE — Progress Notes (Signed)
 Physical Therapy Treatment  Patient Details Name: Joshua Jarvis, Dr. MRN: [id number] DOB: 1951/02/25 Today's Date: 04/20/2024   History of Present Illness Pt is a 73 y.o. M presenting to [facility] on 04/19/24 for elective R THA. PMH is significant CAD, hypercholesterolemia, and monoarthritis of R foot.    PT Comments  Pt admitted with above diagnosis. At the time of PT eval, pt was able to demonstrate transfers and ambulation with gross supervision for safety and RW for support. Pt was educated on HEP, appropriate activity progression, and car transfer. Pt currently with functional limitations due to the deficits listed below (see PT Problem List). Pt will benefit from skilled PT to increase their independence and safety with mobility to allow discharge to the venue listed below.      If plan is discharge home, recommend the following: A little help with walking and/or transfers;A little help with bathing/dressing/bathroom;Assist for transportation;Help with stairs or ramp for entrance   Can travel by private vehicle        Equipment Recommendations  None recommended by PT    Recommendations for Other Services       Precautions / Restrictions Precautions Precautions: Fall Recall of Precautions/Restrictions: Intact Restrictions Weight Bearing Restrictions Per Provider Order: Yes RLE Weight Bearing Per Provider Order: Weight bearing as tolerated     Mobility  Bed Mobility Overal bed mobility: Needs Assistance Bed Mobility: Supine to Sit, Sit to Supine     Supine to sit: Supervision, HOB elevated Sit to supine: Supervision, HOB elevated   General bed mobility comments: Increased time to complete. Pt uses BUE to assist RLE into bed.    Transfers Overall transfer level: Needs assistance Equipment used: Rolling walker (2 wheels) Transfers: Sit to/from Stand Sit to Stand: Supervision           General transfer comment: VC's for hand placement on seated surface for safety. No  assist required but supervision provided for safety.    Ambulation/Gait Ambulation/Gait assistance: Supervision Gait Distance (Feet): 250 Feet Assistive device: Rolling walker (2 wheels) Gait Pattern/deviations: Step-through pattern, Decreased stride length, Decreased step length - right, Decreased stance time - right Gait velocity: Decreased Gait velocity interpretation: 1.31 - 2.62 ft/sec, indicative of limited community ambulator   General Gait Details: VC's for improved posture,  heel strike and step-through gait pattern. Pt was able to ambulate well without overt LOB.   Stairs Stairs: Yes Stairs assistance: Contact guard assist Stair Management: One rail Right, Step to pattern, Forwards, Sideways Number of Stairs: 5 General stair comments: VC's for sequencing and general safety. Initially forward facing and transitioned to sideways for more stability.   Wheelchair Mobility     Tilt Bed    Modified Rankin (Stroke Patients Only)       Balance Overall balance assessment: Needs assistance Sitting-balance support: No upper extremity supported, Feet supported Sitting balance-Leahy Scale: Good Sitting balance - Comments: seated EOB   Standing balance support: Bilateral upper extremity supported, During functional activity, Reliant on assistive device for balance Standing balance-Leahy Scale: Poor Standing balance comment: reliant on external support                            Communication Communication Communication: No apparent difficulties  Cognition Arousal: Alert Behavior During Therapy: WFL for tasks assessed/performed   PT - Cognitive impairments: No apparent impairments  Following commands: Intact      Cueing Cueing Techniques: Verbal cues, Gestural cues  Exercises Total Joint Exercises Ankle Circles/Pumps: 10 reps Quad Sets: 10 reps Short Arc Quad: 10 reps Heel Slides: 10 reps Hip ABduction/ADduction: 10  reps Long Arc Quad: 10 reps    General Comments        Pertinent Vitals/Pain Pain Assessment Pain Assessment: 0-10 Pain Score: 4  Pain Location: R hip Pain Descriptors / Indicators: Constant, Discomfort, Grimacing, Sore Pain Intervention(s): Limited activity within patient's tolerance, Monitored during session, Repositioned    Home Living Family/patient expects to be discharged to:: Private residence Living Arrangements: Spouse/significant other Available Help at Discharge: Family;Available 24 hours/day Type of Home: House Home Access: Stairs to enter Entrance Stairs-Rails: None Entrance Stairs-Number of Steps: 1   Home Layout: One level Home Equipment: Agricultural consultant (2 wheels);Cane - single point;Crutches;Toilet riser      Prior Function            PT Goals (current goals can now be found in the care plan section) Acute Rehab PT Goals Patient Stated Goal: to go home PT Goal Formulation: With patient Time For Goal Achievement: 05/03/24 Potential to Achieve Goals: Good Progress towards PT goals: Progressing toward goals    Frequency    7X/week      PT Plan      Co-evaluation              AM-PAC PT 6 Clicks Mobility   Outcome Measure  Help needed turning from your back to your side while in a flat bed without using bedrails?: A Little Help needed moving from lying on your back to sitting on the side of a flat bed without using bedrails?: A Little Help needed moving to and from a bed to a chair (including a wheelchair)?: A Little Help needed standing up from a chair using your arms (e.g., wheelchair or bedside chair)?: A Little Help needed to walk in hospital room?: A Little Help needed climbing 3-5 steps with a railing? : A Little 6 Click Score: 18    End of Session Equipment Utilized During Treatment: Gait belt Activity Tolerance: Patient tolerated treatment well Patient left: in bed;with call bell/phone within reach Nurse Communication:  Mobility status PT Visit Diagnosis: Other abnormalities of gait and mobility (R26.89);Muscle weakness (generalized) (M62.81);Pain Pain - Right/Left: Right Pain - part of body: Hip     Time: 0905-0957 PT Time Calculation (min) (ACUTE ONLY): 52 min  Charges:    $Gait Training: 23-37 mins $Therapeutic Exercise: 8-22 mins PT General Charges $$ ACUTE PT VISIT: 1 Visit                     Leita Sable, PT, DPT Acute Rehabilitation Services Secure Chat Preferred Office: [phone]    Leita JONETTA Sable 04/20/2024, 1:04 PM

## 2024-04-22 ENCOUNTER — Telehealth: Payer: Self-pay | Admitting: Radiology

## 2024-04-22 NOTE — Telephone Encounter (Signed)
 Joshua Jarvis needs verbal orders for PT visits 1 week x 1, 3 week x 2. Please call to advise.

## 2024-04-25 NOTE — Telephone Encounter (Signed)
Verbal left on VM 

## 2024-05-02 ENCOUNTER — Ambulatory Visit (INDEPENDENT_AMBULATORY_CARE_PROVIDER_SITE_OTHER): Payer: PRIVATE HEALTH INSURANCE | Admitting: Orthopaedic Surgery

## 2024-05-02 DIAGNOSIS — Z96641 Presence of right artificial hip joint: Secondary | ICD-10-CM

## 2024-05-02 NOTE — Progress Notes (Signed)
 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  He says he is doing well.  He has been compliant with a baby aspirin  twice a day.  He is ambulating with a walker and home health is working on transitioning him to a cane later this week.  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  He is making excellent progress.  His right hip does have some swelling.  I did remove the staples and place Steri-Strips.  His leg lengths are equal.  There is no knee pain and he was having a lot of knee pain prior to surgery.  He will slowly increase his activities as comfort allows.  He will wait 2 more weeks at least before submerging underwater.  I would like to see him back in a month to see how he is doing overall.  He can stop his baby aspirin  twice daily as well.  He can also drive since he is not on narcotics.

## 2024-06-02 ENCOUNTER — Ambulatory Visit (INDEPENDENT_AMBULATORY_CARE_PROVIDER_SITE_OTHER): Payer: PRIVATE HEALTH INSURANCE | Admitting: Orthopaedic Surgery

## 2024-06-02 DIAGNOSIS — Z96641 Presence of right artificial hip joint: Secondary | ICD-10-CM

## 2024-06-02 NOTE — Progress Notes (Signed)
 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  He is walking without assistive ice.  He is an active 73 year old gentleman.  He is little more sore today from doing a lot of heavy yard work as well as getting back to golf.  His right hip is moving smoothly and fluidly.  His left hip but does have a known nidus of AVN is still asymptomatic and moves smoothly and fluidly.  His leg lengths appear equal.  He is walking with just slight limp.  He will continue to increase his activities as comfort allows with no restrictions other than no running.  The next time we need to see him is not for 6 months unless he is having issues.  Will have a standing AP pelvis and lateral of his right hip at that visit.  If he can become symptomatic at any time with his left hip he will reach out to us .  At his next visit if he is having left hip pain we will also include a lateral of the left hip.

## 2024-08-15 ENCOUNTER — Encounter: Payer: Self-pay | Admitting: Radiology

## 2024-08-15 DIAGNOSIS — C61 Malignant neoplasm of prostate: Secondary | ICD-10-CM | POA: Diagnosis not present

## 2024-08-15 DIAGNOSIS — Z9889 Other specified postprocedural states: Secondary | ICD-10-CM | POA: Diagnosis not present

## 2024-08-15 DIAGNOSIS — N3941 Urge incontinence: Secondary | ICD-10-CM | POA: Diagnosis not present

## 2024-08-17 DIAGNOSIS — C61 Malignant neoplasm of prostate: Secondary | ICD-10-CM | POA: Diagnosis not present

## 2024-08-17 DIAGNOSIS — H469 Unspecified optic neuritis: Secondary | ICD-10-CM | POA: Diagnosis not present

## 2024-08-17 DIAGNOSIS — Z9889 Other specified postprocedural states: Secondary | ICD-10-CM | POA: Diagnosis not present

## 2024-08-25 DIAGNOSIS — H5213 Myopia, bilateral: Secondary | ICD-10-CM | POA: Diagnosis not present

## 2024-08-25 DIAGNOSIS — H4720 Unspecified optic atrophy: Secondary | ICD-10-CM | POA: Diagnosis not present

## 2024-08-25 DIAGNOSIS — H2513 Age-related nuclear cataract, bilateral: Secondary | ICD-10-CM | POA: Diagnosis not present

## 2024-08-29 DIAGNOSIS — G3781 Myelin oligodendrocyte glycoprotein antibody disease: Secondary | ICD-10-CM | POA: Diagnosis not present

## 2024-08-31 DIAGNOSIS — H469 Unspecified optic neuritis: Secondary | ICD-10-CM | POA: Diagnosis not present

## 2024-08-31 DIAGNOSIS — C61 Malignant neoplasm of prostate: Secondary | ICD-10-CM | POA: Diagnosis not present

## 2024-08-31 DIAGNOSIS — Z9889 Other specified postprocedural states: Secondary | ICD-10-CM | POA: Diagnosis not present

## 2024-12-01 ENCOUNTER — Ambulatory Visit: Payer: PRIVATE HEALTH INSURANCE | Admitting: Orthopaedic Surgery
